# Patient Record
Sex: Male | Born: 1970 | Race: White | Hispanic: No | Marital: Married | Smoking: Former smoker
Health system: Southern US, Community
[De-identification: ages and names within clinical notes are randomized; demographics above are authoritative.]

## PROBLEM LIST (undated history)

## (undated) DIAGNOSIS — G4730 Sleep apnea, unspecified: Secondary | ICD-10-CM

## (undated) DIAGNOSIS — F419 Anxiety disorder, unspecified: Secondary | ICD-10-CM

## (undated) DIAGNOSIS — T7840XA Allergy, unspecified, initial encounter: Secondary | ICD-10-CM

## (undated) DIAGNOSIS — J45909 Unspecified asthma, uncomplicated: Secondary | ICD-10-CM

## (undated) DIAGNOSIS — Z889 Allergy status to unspecified drugs, medicaments and biological substances status: Secondary | ICD-10-CM

## (undated) DIAGNOSIS — E785 Hyperlipidemia, unspecified: Secondary | ICD-10-CM

## (undated) DIAGNOSIS — G43909 Migraine, unspecified, not intractable, without status migrainosus: Secondary | ICD-10-CM

## (undated) DIAGNOSIS — Z923 Personal history of irradiation: Secondary | ICD-10-CM

## (undated) DIAGNOSIS — M1990 Unspecified osteoarthritis, unspecified site: Secondary | ICD-10-CM

## (undated) DIAGNOSIS — C801 Malignant (primary) neoplasm, unspecified: Secondary | ICD-10-CM

## (undated) DIAGNOSIS — J3489 Other specified disorders of nose and nasal sinuses: Secondary | ICD-10-CM

## (undated) DIAGNOSIS — F32A Depression, unspecified: Secondary | ICD-10-CM

## (undated) DIAGNOSIS — R040 Epistaxis: Secondary | ICD-10-CM

## (undated) HISTORY — DX: Anxiety disorder, unspecified: F41.9

## (undated) HISTORY — PX: VASECTOMY: SHX75

## (undated) HISTORY — DX: Allergy, unspecified, initial encounter: T78.40XA

## (undated) HISTORY — DX: Depression, unspecified: F32.A

## (undated) HISTORY — DX: Malignant (primary) neoplasm, unspecified: C80.1

## (undated) HISTORY — DX: Sleep apnea, unspecified: G47.30

## (undated) HISTORY — DX: Unspecified osteoarthritis, unspecified site: M19.90

---

## 2004-03-26 ENCOUNTER — Ambulatory Visit (HOSPITAL_BASED_OUTPATIENT_CLINIC_OR_DEPARTMENT_OTHER): Admission: RE | Admit: 2004-03-26 | Discharge: 2004-03-26 | Payer: Self-pay | Admitting: *Deleted

## 2008-01-17 ENCOUNTER — Ambulatory Visit (HOSPITAL_BASED_OUTPATIENT_CLINIC_OR_DEPARTMENT_OTHER): Admission: RE | Admit: 2008-01-17 | Discharge: 2008-01-17 | Payer: Self-pay | Admitting: Family Medicine

## 2008-01-21 ENCOUNTER — Ambulatory Visit: Payer: Self-pay | Admitting: Internal Medicine

## 2010-09-23 NOTE — Procedures (Signed)
NAME:  Micheal Faulkner, Micheal Faulkner                ACCOUNT NO.:  [id number]   MEDICAL RECORD NO.:  [id number]          PATIENT TYPE:  OUT   LOCATION:  SLEEP CENTER                 FACILITY:  [facility]   PHYSICIAN:  Clinton D. Maple Hudson, MD, FCCP, FACPDATE OF BIRTH:  1969-08-06   DATE OF STUDY:  01/17/2008                            NOCTURNAL POLYSOMNOGRAM   REFERRING PHYSICIAN:  Shade Flood, M.D.   INDICATION FOR STUDY:  Hypersomnia with sleep apnea.   EPWORTH SLEEPINESS SCORE:  10/24.  BMI 26.4.  Weight 184 pounds, height  70 inches.  Neck 17 inches.  A previous study on March 26, 2004, had  recorded an AHI of 19.6 per hour.  At that time his weight was recorded  at 187 pounds.   HOME MEDICATIONS:  Charted and reviewed.   SLEEP ARCHITECTURE:  Total sleep time 366 minutes with sleep efficiency  79.1%.  Stage 1 was 7.7%.  Stage 2 was 72.3%.  Stage 3 was 4.4%.  REM  15.7% of total sleep time.  Sleep latency 65 minutes.  Rem latency 71.5  minutes.  Awake after sleep onset 31 minutes.  Arousal index 10.5.  Bedtime medication was valerian root 225 mg.   RESPIRATORY DATA:  Apnea/hypopnea index (AHI), 4.4 per hour.  Respiratory disturbance index (RDI), 4.6 per hour.  Respiratory events  with related arousals:  Counted only 1.  A total of 27 events were  scored, all hypopneas.  All events were reported as non-supine.  REM AHI  was 17.7.   OXYGEN DATA:  Loud snoring with oxygen desaturation to a Nadir of 88%.  Mean oxygen saturation through the study 94% on room air.   CARDIAC DATA:  Normal sinus rhythm.   MOVEMENT/PARASOMNIA:  No significant movement disturbance.  No bathroom  trips.   IMPRESSIONS/RECOMMENDATIONS:  1. Occasional respiratory events disturbing sleep, within normal      limits.  Apnea/hypopnea index 4.4 per hour (normal range 0-5 per      hour).  The events were primarily associated with rapid eye      movement.  Snoring was loud with oxygen desaturation to a Nadir of      88%.  2. On a previous study of March 26, 2004, the apnea/hypopnea index      was reported at 19.6 per hour.  Recorded weight was a few pounds      heavier at 187 pounds.  3. The assessment of snoring may reveal correctable upper airway nasal      congestion or obstruction.  He can be encouraged to sleep off the      side of his back, to consider a chin strap and to maintain good      sleep habits.      Clinton D. Maple Hudson, MD, FCCP, FACP  Diplomate, Biomedical engineer of Sleep Medicine  Electronically Signed     CDY/MEDQ  D:  01/21/2008 10:44:00  T:  01/21/2008 11:33:50  Job:  [id number]

## 2010-09-26 NOTE — Procedures (Signed)
NAME:  Micheal Faulkner, Micheal Faulkner                ACCOUNT NO.:  [id number]   MEDICAL RECORD NO.:  [id number]          PATIENT TYPE:  OUT   LOCATION:  SLEEP CENTER                 [facility]ACILITY[facility]  [facility]   PHYSICIAN:  Clinton D. Maple Hudson, M.D. DATE OF BIRTH:  1970/06/03   DATE OF STUDY:  03/26/2004                              NOCTURNAL POLYSOMNOGRAM   REFERRING PHYSICIAN:  Coralee Rud, MD   INDICATIONS FOR STUDY:  Hypersomnia with sleep apnea. Epworth sleepiness  score 14/24, BMI 26, weight 187 pounds.   SLEEP ARCHITECTURE:  Total sleep time 367 minutes with sleep efficiency of  89%. Stage I was 5%, stage II was 70%, stages III and IV were 7%, REM was  18% of total sleep time. Latency to sleep onset 8 minutes. Latency to REM  179 minutes. Awake after sleep onset 40 minutes. Arousal index elevated 75  per hour. Most arousals were nonspecific.   RESPIRATORY DATA:  RDI 19.6 per hour indicating moderate obstructive sleep  apnea/hypopnea syndrome. This included 23 obstructive apneas and 97  hypopneas. Events were not positional. Split protocol titration could not be  performed because there were insufficient events to prevent evaluation.   OXYGEN DATA:  Variable snoring, mild to loud, with oxygen desaturation to an  nadir of 81% during apneas. Mean oxygen saturation through study was 94% on  room air.   CARDIAC DATA:  Normal sinus rhythm.   MOVEMENT/PARASOMNIA:  Occasional leg jerk with little effect on sleep.   IMPRESSION/RECOMMENDATIONS:  Moderate obstructive sleep apnea/hypopnea  syndrome, respiratory disturbance index 19.6 per hour with desaturation to  81%. Consider return for CPAP titration if clinically appropriate.                                                           Clinton D. Maple Hudson, M.D.  Diplomate, [employer] Board   CDY/MEDQ  D:  03/30/2004 14:59:11  T:  03/30/2004 22:15:22  Job:  [id number]

## 2013-03-11 ENCOUNTER — Emergency Department (HOSPITAL_COMMUNITY)
Admission: EM | Admit: 2013-03-11 | Discharge: 2013-03-11 | Disposition: A | Discharge status: Home / Self Care | Payer: BC Managed Care – PPO | Source: Home / Self Care | Attending: Emergency Medicine | Admitting: Emergency Medicine

## 2013-03-11 ENCOUNTER — Encounter (HOSPITAL_COMMUNITY): Payer: Self-pay | Admitting: Emergency Medicine

## 2013-03-11 DIAGNOSIS — S39012A Strain of muscle, fascia and tendon of lower back, initial encounter: Secondary | ICD-10-CM

## 2013-03-11 DIAGNOSIS — S335XXA Sprain of ligaments of lumbar spine, initial encounter: Secondary | ICD-10-CM

## 2013-03-11 MED ORDER — DICLOFENAC SODIUM 75 MG PO TBEC: 75.0000 mg | DELAYED_RELEASE_TABLET | Freq: Two times a day (BID) | ORAL | Status: AC

## 2013-03-11 MED ORDER — OXYCODONE-ACETAMINOPHEN 5-325 MG PO TABS: ORAL_TABLET | ORAL | Status: DC

## 2013-03-11 MED ORDER — KETOROLAC TROMETHAMINE 60 MG/2ML IM SOLN
INTRAMUSCULAR | Status: AC
  Filled 2013-03-11: qty 2

## 2013-03-11 MED ORDER — TIZANIDINE HCL 4 MG PO TABS: 4.0000 mg | ORAL_TABLET | Freq: Four times a day (QID) | ORAL | Status: DC | PRN

## 2013-03-11 MED ORDER — KETOROLAC TROMETHAMINE 60 MG/2ML IM SOLN
60.0000 mg | Freq: Once | INTRAMUSCULAR | Status: AC
  Administered 2013-03-11: 60 mg via INTRAMUSCULAR

## 2013-03-11 NOTE — ED Notes (Signed)
Pt reports  Back pain for  About 1  Week  Not releived  By  Flexeril  -  Pt  Reports         Hurts  On  Certain  Movements  /  posistions            Pt  denys  Any  specefic  Urinary  Symptoms           Ambulated  To  Room with a  Steady fluid  Gait

## 2013-03-11 NOTE — ED Provider Notes (Signed)
Chief Complaint:   Chief Complaint  Patient presents with  . Back Pain    History of Present Illness:   Micheal Faulkner is a 71 or male who has had a one-week history of lower back pain. He denies any injury. The pain does not radiate into the legs. There is no numbness, tingling, paresthesias, or weakness in the legs. The pain is rated 8/10 in intensity. He has tried cyclobenzaprine without relief. He does not have any prior history of back problems. He denies any incontinence of bladder or bowel, saddle anesthesia, fevers, chills, or unintended weight loss.  Review of Systems:  Other than noted above, the patient denies any of the following symptoms: Systemic:  No fever, chills, severe fatigue, or unexplained weight loss. GI:  No abdominal pain, nausea, vomiting, diarrhea, constipation, incontinence of bowel, or blood in stool. GU:  No dysuria, frequency, urgency, or hematuria. No incontinence of urine or difficulty urinating.  M-S:  No neck pain, joint pain, arthritis, or myalgias. Neuro:  No paresthesias, saddle anesthesia, muscular weakness, or progressive neurological deficit.  PMFSH:  Past medical history, family history, social history, meds, and allergies were reviewed. Specifically, there is no history of cancer, major trauma, osteoporosis, immunosuppression, or HIV infection. He has elevated cholesterol and takes Lipitor. He also takes testosterone gel.  Physical Exam:   Vital signs:  BP 152/95  Pulse 69  Temp(Src) 98.1 F (36.7 C) (Oral)  Resp 16  SpO2 100% General:  Alert, oriented, in no distress. Abdomen:  Soft, non-tender.  No organomegaly or mass.  No pulsatile midline abdominal mass or bruit. Back:  Minimal pain to palpation in the lower lumbar spine bilaterally just above the iliac crest. No pain over the sacroiliac joints and no pain at the midline. The back has a limited range of motion with 30 of flexion, 10 of extension, 20 of lateral bending, and 90 of rotation with  pain. Straight leg raising is negative on the left. On the right he has some lower back pain with straight leg raising no leg pain. Lasegue's sign and popliteal compression are negative. Neuro:  Normal muscle strength, sensations and DTRs. Extremities: Pedal pulses were full, there was no edema. Skin:  Clear, warm and dry.  No rash.  Course in Urgent Care Center:   Given Toradol 60 mg IM.  Assessment:  The encounter diagnosis was Lumbar strain, initial encounter.  No evidence of lumbar radiculopathy or cauda equina syndrome.  Plan:   1.  Meds:  The following meds were prescribed:   Discharge Medication List as of 03/11/2013 12:13 PM    START taking these medications   Details  diclofenac (VOLTAREN) 75 MG EC tablet Take 1 tablet (75 mg total) by mouth 2 (two) times daily., Starting 03/11/2013, Until Discontinued, Normal    oxyCODONE-acetaminophen (PERCOCET) 5-325 MG per tablet 1 to 2 tablets every 6 hours as needed for pain., Print    tiZANidine (ZANAFLEX) 4 MG tablet Take 1 tablet (4 mg total) by mouth every 6 (six) hours as needed., Starting 03/11/2013, Until Discontinued, Normal        2.  Patient Education/Counseling:  The patient was given appropriate handouts, self care instructions, and instructed in symptomatic relief. The patient was encouraged to try to be as active as possible and given some exercises to do followed by moist heat.  3.  Follow up:  The patient was told to follow up if no better in 3 to 4 days, if becoming worse in any  way, and given some red flag symptoms such as worsening pain or new neurological symptoms which would prompt immediate return.  Follow up with his primary care physician, Dr. Mosetta Putt in 2 weeks.     Reuben Likes, MD 03/11/13 1322

## 2013-11-01 ENCOUNTER — Other Ambulatory Visit: Payer: Self-pay | Admitting: Family Medicine

## 2013-11-01 DIAGNOSIS — R748 Abnormal levels of other serum enzymes: Secondary | ICD-10-CM

## 2013-11-09 ENCOUNTER — Ambulatory Visit
Admission: RE | Admit: 2013-11-09 | Discharge: 2013-11-09 | Disposition: A | Discharge status: Home / Self Care | Payer: BC Managed Care – PPO | Source: Ambulatory Visit | Attending: Family Medicine | Admitting: Family Medicine

## 2013-11-09 DIAGNOSIS — R748 Abnormal levels of other serum enzymes: Secondary | ICD-10-CM

## 2016-11-19 ENCOUNTER — Other Ambulatory Visit: Payer: Self-pay | Admitting: Family Medicine

## 2016-11-19 DIAGNOSIS — R51 Headache: Secondary | ICD-10-CM

## 2016-11-19 DIAGNOSIS — J329 Chronic sinusitis, unspecified: Secondary | ICD-10-CM

## 2016-11-19 DIAGNOSIS — R519 Headache, unspecified: Secondary | ICD-10-CM

## 2016-11-20 ENCOUNTER — Other Ambulatory Visit: Payer: Self-pay

## 2016-11-23 ENCOUNTER — Ambulatory Visit
Admission: RE | Admit: 2016-11-23 | Discharge: 2016-11-23 | Disposition: A | Discharge status: Home / Self Care | Payer: PRIVATE HEALTH INSURANCE | Source: Ambulatory Visit | Attending: Family Medicine | Admitting: Family Medicine

## 2016-11-23 DIAGNOSIS — R519 Headache, unspecified: Secondary | ICD-10-CM

## 2016-11-23 DIAGNOSIS — R51 Headache: Secondary | ICD-10-CM

## 2016-11-23 DIAGNOSIS — J329 Chronic sinusitis, unspecified: Secondary | ICD-10-CM

## 2016-11-23 MED ORDER — IOPAMIDOL (ISOVUE-300) INJECTION 61%
75.0000 mL | Freq: Once | INTRAVENOUS | Status: AC | PRN
  Administered 2016-11-23: 75 mL via INTRAVENOUS

## 2016-12-09 DIAGNOSIS — J3489 Other specified disorders of nose and nasal sinuses: Secondary | ICD-10-CM

## 2016-12-09 DIAGNOSIS — R040 Epistaxis: Secondary | ICD-10-CM

## 2016-12-09 HISTORY — DX: Other specified disorders of nose and nasal sinuses: J34.89

## 2016-12-09 HISTORY — DX: Epistaxis: R04.0

## 2017-01-19 HISTORY — PX: OTHER SURGICAL HISTORY: SHX169

## 2017-01-19 HISTORY — PX: RHINECTOMY: SUR1283

## 2017-02-11 HISTORY — PX: FREE FLAP RADIAL FOREARM: SHX1678

## 2017-02-11 HISTORY — PX: OTHER SURGICAL HISTORY: SHX169

## 2017-03-26 ENCOUNTER — Encounter: Payer: Self-pay | Admitting: Radiation Oncology

## 2017-03-26 ENCOUNTER — Telehealth: Payer: Self-pay | Admitting: *Deleted

## 2017-03-26 NOTE — Telephone Encounter (Signed)
Oncology Nurse Navigator Documentation  Requested via fax imaging from [facility] for new referral received today, 11/16: . CT Neck, 11/23/2016 . MR Face and Sinus W WO Contrast, 01/07/2017 . CT Neck/Thyroid W Contrast, 02/01/2017 . CT Chest W Contrast, 02/01/2017 Notification of successful fax transmission received.  Gayleen Orem, RN, BSN, [place] Neck Oncology Nurse [place] at [place] [phone]

## 2017-03-26 NOTE — Telephone Encounter (Signed)
Oncology Nurse Navigator Documentation  Placed new referral introductory call to patient, LVMM asking for call back.  Gayleen Orem, RN, BSN, [place] Neck Oncology Nurse [place] at [place] [phone]

## 2017-03-26 NOTE — Progress Notes (Signed)
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 C.SUPERIOR SEPTUM (BIOPSY): Benign salivary gland, negative for malignancy. D.RIGHT PIRIFORM APERTURE (BIOPSY): Benign skeletal muscle, negative for malignancy. E.LEFT NASAL VESTIBULE (BIOPSY): Atypical epithelial cell nests with extensive crushed artifact, suspicious for squamous cell carcinoma. See comment. F.LEFT PIRIFORM VESTIBULE (BIOPSY): Atypical epithelial cell nests with extensive crushed artifact, suspicious for squamous cell carcinoma.See comment. 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 I.RIGHT NEW DORSAL MARGIN (BIOPSY): Benign skin with solar elastosis, negative for malignancy. J.LEFT NEW DORSAL MARGIN (BIOPSY): Benign skin with solar elastosis, negative for malignancy. 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 M.LEFT FLOOR OF NOSE MUCOSA (RESECTION): Benign sinonasal mucosal tissue, negative for malignancy. N.RIGHT FLOOR OF NOSE MUCOSA (RESECTION): Benign sinonasal mucosal tissue, negative for malignancy. O.SOFT TISSUE MASS, LEFT NASAL VESTIBULE (RESECTION): Benign mucinous salivary gland, negative for malignancy. P.RIGHT LATERAL NASAL WALL (RESECTION): Benign sinonasal mucosal tissue and bone,  negative for malignancy. Q.RIGHT MIDDLE TURBINATE (RESECTION): Benign sinonasal mucosal tissue and bone, negative for malignancy. 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 S.POSTERIOR SEPTUM (RESECTION): Benign sinonasal mucosal tissue, bone, negative for malignancy. 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 Tumor seen widely and extensively involving sinonasal mucosal tissues, soft tissue and septal bone. No definitive lymphovascular or perineural invasion identified. Margins of resection including posterior, inferior, medial positive for tumor. U.DEEP COLUMELLA (RESECTION): Benign skin with marked acute and chronic inflammation, cartilage, negative for malignancy. 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   Patient presented months ago with symptoms of: Micheal Faulkner presented to Dr. Janace Hoard on 12/07/16 with a 4 month history of significant nasal congestion for 4 months with bleeding. Micheal Faulkner saw Dr. Vicie Faulkner for the first time on 01/04/17.  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   Nutrition Status Yes No Comments  Weight changes? [x]  []  Micheal Faulkner reports losing about 30 lbs since August. Micheal Faulkner has gained back about 10 lbs.   Swallowing concerns? [x]  []  The right side of his mouth is numb. Micheal Faulkner has difficulty swallowing. Micheal Faulkner is eating softer foods and cuts his food well before eating  PEG? []  [x]     Referrals Yes No Comments   Social Work? [x]  []  Micheal Faulkner has seen social work at [facility]. Micheal Faulkner will be referred to social work here based on his distress screening.   Dentistry? [x]  []  Dr. Enrique Faulkner today  Swallowing therapy? []  [x]    Nutrition? []  [x]    Med/Onc? []  [x]     Safety Issues Yes No Comments  Prior radiation? []  [x]    Pacemaker/ICD? []  [x]    Possible current pregnancy? []  [x]    Is the patient on methotrexate? []  [x]     Tobacco/Marijuana/Snuff/ETOH use: Micheal Faulkner quit smoking and drinking in August with his cancer  diagnosis.   Past/Anticipated interventions by otolaryngology, if any:  Dr. Vicie Faulkner [facility] Procedures/Surgeries performed during hospitalization:  01/19/2017: Partial rhinectomy, septectomy, and right medial maxillectomy   02/11/17 Dr. Brayton Faulkner Procedures/Surgeries performed during hospitalization:  ROTATION FOREHEAD FLAP NASOLABIAL FLAP RIB GRAFT HARVEST FREE FLAP RADIAL FOREARM- Dr Micheal Faulkner    Past/Anticipated interventions by medical oncology, if any:  none   Current Complaints / other details:    BP (!) 102/59   Pulse (!) 55   Temp (!) 97.5 F (36.4 C)   Ht 5\' 9"  (1.753 m)   Wt 165 lb 12.8 oz (75.2 kg)   SpO2 99% Comment: room air  BMI 24.48 kg/m    Wt Readings from Last 3 Encounters:  03/31/17 165 lb 12.8 oz (75.2 kg)

## 2017-03-28 ENCOUNTER — Ambulatory Visit
Admission: RE | Admit: 2017-03-28 | Discharge: 2017-03-28 | Disposition: A | Discharge status: Home / Self Care | Payer: Self-pay | Source: Ambulatory Visit | Attending: Radiation Oncology | Admitting: Radiation Oncology

## 2017-03-28 ENCOUNTER — Encounter: Payer: Self-pay | Admitting: *Deleted

## 2017-03-28 ENCOUNTER — Other Ambulatory Visit: Payer: Self-pay | Admitting: *Deleted

## 2017-03-28 DIAGNOSIS — C300 Malignant neoplasm of nasal cavity: Secondary | ICD-10-CM

## 2017-03-28 NOTE — Progress Notes (Signed)
Oncology Nurse Navigator Documentation  Orders placed for outside film [employer] import.  Films requested by fax to [facility] 03/26/2017, same day verbal acknowledgement received   Gayleen Orem, RN, BSN, [place] at [place] [phone]

## 2017-03-29 ENCOUNTER — Telehealth: Payer: Self-pay | Admitting: *Deleted

## 2017-03-29 NOTE — Telephone Encounter (Signed)
Oncology Nurse Navigator Documentation  Placed introductory call to new referral patient Micheal Faulkner in follow-up to previous VMM.  Introduced myself as the H&N oncology nurse navigator that works with Dr. Isidore Moos to whom he has been referred by Dr. Vicie Mutters.    He confirmed his understanding of referral and appt date/time of 11/21 8:30/9:00, stated either his wife or dtr wil be joining him.  I encouraged 8:15 arrival for registration.  I briefly explained my role as his navigator, indicated that I would be joining him during his appt.  I explained purpose of pre-radiotherapy dental evaluation, indicated he would be contacted by [place] to arrange appt.  He indicated has all own teeth, 1 tooth capped; hasn't seen dentist "in a while".  Answered his questions re timeline to start tmt.    I confirmed his understanding of [place] location, explained arrival and RadOnc registration process for appt.  I provided my contact information, encouraged him to call with questions/concerns prioor to appt.  He verbalized understanding of information provided, expressed appreciation for my call.  Gayleen Orem, RN, BSN, [place] Neck Oncology Nurse [place] at [place] [phone]

## 2017-03-30 DIAGNOSIS — C300 Malignant neoplasm of nasal cavity: Secondary | ICD-10-CM

## 2017-03-30 DIAGNOSIS — Z01818 Encounter for other preprocedural examination: Secondary | ICD-10-CM

## 2017-03-31 ENCOUNTER — Encounter: Payer: Self-pay | Admitting: Radiation Oncology

## 2017-03-31 ENCOUNTER — Encounter: Payer: Self-pay | Admitting: *Deleted

## 2017-03-31 ENCOUNTER — Other Ambulatory Visit (HOSPITAL_COMMUNITY): Payer: Self-pay | Admitting: Dentistry

## 2017-03-31 ENCOUNTER — Ambulatory Visit
Admission: RE | Admit: 2017-03-31 | Discharge: 2017-03-31 | Disposition: A | Discharge status: Home / Self Care | Payer: PRIVATE HEALTH INSURANCE | Source: Ambulatory Visit | Attending: Radiation Oncology | Admitting: Radiation Oncology

## 2017-03-31 ENCOUNTER — Ambulatory Visit (HOSPITAL_COMMUNITY): Payer: Self-pay | Admitting: Dentistry

## 2017-03-31 ENCOUNTER — Encounter (HOSPITAL_COMMUNITY): Payer: Self-pay | Admitting: Dentistry

## 2017-03-31 ENCOUNTER — Other Ambulatory Visit: Payer: Self-pay | Admitting: Radiation Oncology

## 2017-03-31 VITALS — BP 102/59 | HR 55 | Temp 97.5°F | Ht 69.0 in | Wt 165.8 lb

## 2017-03-31 VITALS — BP 112/69 | HR 52 | Temp 97.8°F

## 2017-03-31 DIAGNOSIS — Z510 Encounter for antineoplastic radiation therapy: Secondary | ICD-10-CM | POA: Insufficient documentation

## 2017-03-31 DIAGNOSIS — C300 Malignant neoplasm of nasal cavity: Secondary | ICD-10-CM | POA: Insufficient documentation

## 2017-03-31 DIAGNOSIS — M270 Developmental disorders of jaws: Secondary | ICD-10-CM

## 2017-03-31 DIAGNOSIS — M264 Malocclusion, unspecified: Secondary | ICD-10-CM

## 2017-03-31 DIAGNOSIS — K036 Deposits [accretions] on teeth: Secondary | ICD-10-CM

## 2017-03-31 DIAGNOSIS — K0530 Chronic periodontitis, unspecified: Secondary | ICD-10-CM

## 2017-03-31 DIAGNOSIS — Z01818 Encounter for other preprocedural examination: Secondary | ICD-10-CM

## 2017-03-31 DIAGNOSIS — K030 Excessive attrition of teeth: Secondary | ICD-10-CM

## 2017-03-31 DIAGNOSIS — K08409 Partial loss of teeth, unspecified cause, unspecified class: Secondary | ICD-10-CM

## 2017-03-31 DIAGNOSIS — E785 Hyperlipidemia, unspecified: Secondary | ICD-10-CM | POA: Diagnosis not present

## 2017-03-31 DIAGNOSIS — G43909 Migraine, unspecified, not intractable, without status migrainosus: Secondary | ICD-10-CM | POA: Insufficient documentation

## 2017-03-31 HISTORY — DX: Hyperlipidemia, unspecified: E78.5

## 2017-03-31 HISTORY — DX: Migraine, unspecified, not intractable, without status migrainosus: G43.909

## 2017-03-31 HISTORY — DX: Allergy status to unspecified drugs, medicaments and biological substances: Z88.9

## 2017-03-31 HISTORY — DX: Unspecified asthma, uncomplicated: J45.909

## 2017-03-31 HISTORY — DX: Other specified disorders of nose and nasal sinuses: J34.89

## 2017-03-31 HISTORY — DX: Epistaxis: R04.0

## 2017-03-31 MED ORDER — SODIUM FLUORIDE 1.1 % DT GEL: DENTAL | 99 refills | Status: AC

## 2017-03-31 MED FILL — FLUORISHIELD 1.1% GEL: 1.1 % | 30 days supply | Qty: 114 | Fill #0

## 2017-03-31 NOTE — Progress Notes (Signed)
DENTAL CONSULTATION  Date of Consultation:  03/31/2017 Patient Name:   Tyray Proch Date of Birth:   Oct 04, 1970 Medical Record Number: [id number]  VITALS: BP 112/69 (BP Location: Left Arm)   Pulse (!) 52   Temp 97.8 F (36.6 C) (Oral)   CHIEF COMPLAINT: The patient was referred by Dr. Isidore Moos for dental consultation.  HPI: Aison Malveaux is a 46 year old male recently diagnosed cancer of the nasal cavity.  Patient is status post surgical resection followed by several reconstructive surgeries of his nose and nasal cavity.  Patient with anticipated postoperative radiation therapy.  Patient is now seen as part of a pre-radiation therapy dental protocol examination.  The patient currently denies acute toothaches, swelling, or abscesses.  Patient was last seen by a dentist approximately 2 years ago for an exam and cleaning.  The patient cannot remember the name of the dentist that he saw.  Patient denies having any unmet dental needs.  Patient denies having dental phobia.  PROBLEM LIST: Patient Active Problem List   Diagnosis Date Noted  . 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 Asthma   . Epistaxis, recurrent 12/2016  . H/O seasonal allergies   . Hyperlipidemia   . Migraine   . Nasal mass 12/2016    PSH: Past Surgical History:  Procedure Laterality Date  . FREE FLAP RADIAL FOREARM  02/11/2017  . nasolabial flap  02/11/2017  . posterior septectomy  01/19/2017    Posterior Septectomy, Marijo Conception MD [facility].   Marland Kitchen RHINECTOMY  01/19/2017   Rhinectomy partial, surgeon, Marijo Conception MD at [facility].   . rib graft harvest  02/11/2017  . rotation forehead flap  02/11/2017  . VASECTOMY      ALLERGIES: No Known Allergies  MEDICATIONS: Current Outpatient Medications  Medication Sig Dispense Refill  . acetaminophen (TYLENOL) 500 MG tablet Take 1,000 mg by mouth.    . ALPRAZolam (XANAX) 0.5 MG tablet Take 0.5 mg  by mouth.    Marland Kitchen aspirin 325 MG tablet Take 325 mg by mouth.    . celecoxib (CELEBREX) 200 MG capsule TAKE 1 CAPSULE (200 MG TOTAL) BY MOUTH 2 TIMES DAILY.  2  . diazepam (VALIUM) 5 MG tablet TAKE 1/2 TO 1 TABLET BY MOUTH TWICE A DAY AS NEEDED FOR ANXIETY OR SLEEP  5  . escitalopram (LEXAPRO) 10 MG tablet TAKE 1 TABLET BY MOUTH ONCE DAILY IN THE MORNING  1  . HYDROcodone-acetaminophen (NORCO/VICODIN) 5-325 MG tablet Take by mouth.    Marland Kitchen ibuprofen (ADVIL,MOTRIN) 200 MG tablet Take 200 mg by mouth.    . Multiple Vitamins-Minerals (MULTIVITAMIN WITH MINERALS) tablet Take 1 tablet by mouth daily.    . rosuvastatin (CRESTOR) 10 MG tablet TAKE 1 TABLET BY MOUTH EVERY EVENING AFTER A MEAL  1  . Testosterone 25 MG/2.5GM (1%) GEL Apply 1 packet topically daily.    . traZODone (DESYREL) 50 MG tablet TAKE 1 TABLET BY MOUTH EVERY EVENING AT BEDTIME AS NEEDED FOR SLEEP  3  . V-R NASAL SPRAY SALINE 0.65 % nasal spray   2  . zolpidem (AMBIEN) 10 MG tablet TAKE 1/2 TABLET BY MOUTH AT BEDTIME AS NEEDED FOR SLEEP  2  . diclofenac (VOLTAREN) 75 MG EC tablet Take 1 tablet (75 mg total) by mouth 2 (two) times daily. (Patient not taking: Reported on 03/31/2017) 30 tablet 0  . tadalafil (CIALIS) 10 MG tablet Take 10 mg by mouth.    Marland Kitchen  tiZANidine (ZANAFLEX) 4 MG tablet Take 1 tablet (4 mg total) by mouth every 6 (six) hours as needed. (Patient not taking: Reported on 03/31/2017) 30 tablet 0   No current facility-administered medications for this visit.     LABS: No results found for: WBC, HGB, HCT, MCV, PLT No results found for: NA, K, CL, CO2, GLUCOSE, BUN, CREATININE, CALCIUM, GFRNONAA, GFRAA No results found for: INR, PROTIME No results found for: PTT  SOCIAL HISTORY: Social History   Socioeconomic History  . Marital status: Legally Separated    Spouse name: Not on file  . Number of children: Not on file  . Years of education: Not on file  . Highest education level: Not on file  Social Needs  . Financial  resource strain: Not on file  . Food insecurity - worry: Not on file  . Food insecurity - inability: Not on file  . Transportation needs - medical: Not on file  . Transportation needs - non-medical: Not on file  Occupational History  . Not on file  Tobacco Use  . Smoking status: Former Smoker    Packs/day: 0.50    Years: 30.00    Pack years: 15.00    Last attempt to quit: 01/03/2017    Years since quitting: 0.2  . Smokeless tobacco: Never Used  Substance and Sexual Activity  . Alcohol use: No    Frequency: Never    Comment: he has not had alcohol since cancer diagnosis  . Drug use: No  . Sexual activity: Not on file  Other Topics Concern  . Not on file  Social History Narrative  . Not on file    FAMILY HISTORY: History reviewed. No pertinent family history.  The patient was adopted.  REVIEW OF SYSTEMS: Reviewed with the patient as per History of present illness. Psych: Patient denies having dental phobia.  DENTAL HISTORY: CHIEF COMPLAINT: The patient was referred by Dr. Isidore Moos for dental consultation.  HPI: Tiernan Suto is a 46 year old male recently diagnosed cancer of the nasal cavity.  Patient is status post surgical resection followed by several reconstructive surgeries of his nose and nasal cavity.  Patient with anticipated postoperative radiation therapy.  Patient is now seen as part of a pre-radiation therapy dental protocol examination.  The patient currently denies acute toothaches, swelling, or abscesses.  Patient was last seen by a dentist approximately 2 years ago for an exam and cleaning.  The patient cannot remember the name of the dentist that he saw.  Patient denies having any unmet dental needs.  Patient denies having dental phobia.  DENTAL EXAMINATION: GENERAL: The patient is well-developed, well-nourished male in no acute distress. HEAD AND NECK: The patient's nose and forehead is consistent with surgical resection and reconstruction.  Patient has  evidence of right neck dissection.  There is no left neck lymphadenopathy noted.  Patient has a maximum interincisal opening of approximately 40 mm. INTRAORAL EXAM: Patient has normal saliva.  Patient has bilateral mandibular lingual tori.  There is no evidence of oral abscess formation. DENTITION: Patient is missing tooth numbers 1, 16, 17, and 32.  The patient has generalized incisal and occlusal attrition.   PERIODONTAL: Patient has chronic periodontitis with minimal plaque accumulations, selective areas gingival recession and no significant tooth mobility. DENTAL CARIES/SUBOPTIMAL RESTORATIONS: No obvious dental caries are noted. ENDODONTIC: Patient currently denies acute pulpitis symptoms.  I do not see any evidence of periapical pathology. CROWN AND BRIDGE: The patient has a crown on tooth #21. PROSTHODONTIC: There are  no partial dentures. OCCLUSION: Patient has a poor occlusal scheme secondary to generalized incisal and occlusal attrition and an end to end occlusion.  RADIOGRAPHIC INTERPRETATION: Orthopantogram was taken today. Patient is missing tooth numbers 1, 16, 17, and 32.  There is incipient bone loss noted.  No obvious dental caries are noted.  Multiple dental restorations are noted.  No obvious periapical pathology or radiolucencies are noted.  There are surgical clips involving the right neck noted.   ASSESSMENTS: 1.  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 2.  Anticipated postoperative radiation therapy 3.  Pre-radiation therapy dental protocol 4.  Chronic periodontitis with incipient bone loss 5.  Accretions-minimal 6.  Generalized occlusal and incisal attrition 7.  Malocclusion  PLAN/RECOMMENDATIONS: 1. I discussed the risks, benefits, and complications of various treatment options with the patient in relationship to his medical and dental conditions, anticipated radiation therapy, and radiation therapy side  effects to include xerostomia, radiation caries, trismus, mucositis, taste changes, gum and  jawbone changes, and risk for infection and osteoradionecrosis. We discussed various treatment options to include no treatment, consideration for extraction of teeth in the primary field of radiation therapy, pre-prosthetic surgery as indicated, periodontal therapy, dental restorations, root canal therapy, crown and bridge therapy, implant therapy, and replacement of missing teeth as indicated. The patient currently wishes to defer any dental extractions at this time.  The patient does agree to proceed with impressions today for the future fabrication of a tongue positioner/radiation cone locator as well as fluoride trays. A prescription for fluoride therapy will be sent to [place] with refills for 1 year.     2. Discussion of findings with medical team and coordination of future medical and dental care as needed.  I spent in excess of  120 minutes during the conduct of this consultation and >50% of this time involved direct face-to-face encounter for counseling and/or coordination of the patient's care.    Lenn Cal, DDS

## 2017-03-31 NOTE — Progress Notes (Signed)
Radiation Oncology         [phone] ________________________________  Initial outpatient Consultation  Name: Micheal Faulkner MRN: [id number]  Date: 03/31/2017  DOB: May 29, 1970  ID:[id number], No Pcp Per  [name], MD   REFERRING PHYSICIAN: [name], MD  DIAGNOSIS:    ICD-10-CM   1. 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 male who presented to Dr. Janace Hoard on 12/07/16 with a 4 month history of significant nasal congestion for 4 months with bleeding. He saw Dr. Vicie Mutters for the first time on 01/04/17.  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   Dr. Vicie Mutters performed surgery on 01/19/17 and I have talked to him extensively about the results and procedure. Micheal Faulkner underwent partial Rhinectomy and Right medial maxillectomy. The greatest deminsion of the tumor was for 3.4cm. The histology was Basaloid squamous cell carcinoma, grade 2. While the margins were deemed postive on pathology report, Dr. Vicie Mutters informed me that they were in fact negative. no LVSI or PNI. 16 lymph nodes were removed from the right neck on 02/11/17, these were all negative. He underwent reconstruction of the lower nose by Dr. Brayton Caves and Dr. Vicie Mutters, with flaps from the forehead and radial forearm and rib graft. Patient was discussed at tumor board today and reviewed  his pre and post op imagining.   CT chest staging shows tiny pulmonary nodules favored to be benign.  Patient stated that he rinses out the internal tubes to get rid of odor smell. Patient also stated that a yellow mucous coming out of his nose. Patient was here today with daughter.   Swallowing issues, if any: No, he is able to swallow. But he has difficulty chewing, the right side of his mouth is numb. He is eating softer foods and cuts his food well before eating. No PEG tube.  Weight Changes: yes, he reports losing about 30 lbs since August. He has gained back 10 Ibs.  Tobacco history, if any: He quit smoking and drinking in January 03, 2017 with his cancer diagnosis.   ETOH abuse, if any: No  Exposure to repeated exhaust at work - [employer]  Right eye is a little "foggy" for vision  per history  PREVIOUS RADIATION THERAPY: No  PAST MEDICAL HISTORY:  has a past medical history of Asthma, Epistaxis, recurrent (12/2016), H/O seasonal allergies, Hyperlipidemia, Migraine, and Nasal mass (12/2016).    PAST SURGICAL HISTORY: Past Surgical History:  Procedure Laterality Date  . FREE FLAP RADIAL FOREARM  02/11/2017  . nasolabial flap  02/11/2017  . posterior septectomy  01/19/2017    Posterior Septectomy, Marijo Conception MD [facility].   Marland Kitchen RHINECTOMY  01/19/2017   Rhinectomy partial, surgeon, Marijo Conception MD at [facility].   . rib graft harvest  02/11/2017  . rotation forehead flap  02/11/2017  . VASECTOMY      FAMILY  HISTORY: family history is not on file.  SOCIAL HISTORY:  reports that he quit smoking about 2 months ago. He has a 15.00 pack-year smoking history. he has never used smokeless tobacco. He reports that he does not drink alcohol or use drugs.  ALLERGIES: Patient has no known allergies.  MEDICATIONS:  Current Outpatient Medications  Medication Sig Dispense Refill  . acetaminophen (TYLENOL) 500 MG tablet Take 1,000 mg by mouth.    . ALPRAZolam (XANAX) 0.5 MG  tablet Take 0.5 mg by mouth.    Marland Kitchen aspirin 325 MG tablet Take 325 mg by mouth.    . celecoxib (CELEBREX) 200 MG capsule TAKE 1 CAPSULE (200 MG TOTAL) BY MOUTH 2 TIMES DAILY.  2  . diazepam (VALIUM) 5 MG tablet TAKE 1/2 TO 1 TABLET BY MOUTH TWICE A DAY AS NEEDED FOR ANXIETY OR SLEEP  5  . escitalopram (LEXAPRO) 10 MG tablet TAKE 1 TABLET BY MOUTH ONCE DAILY IN THE MORNING  1  . HYDROcodone-acetaminophen (NORCO/VICODIN) 5-325 MG tablet Take by mouth.    Marland Kitchen ibuprofen (ADVIL,MOTRIN) 200 MG tablet Take 200 mg by mouth.    . Multiple Vitamins-Minerals (MULTIVITAMIN WITH MINERALS) tablet Take 1 tablet by mouth daily.    . rosuvastatin (CRESTOR) 10 MG tablet TAKE 1 TABLET BY MOUTH EVERY EVENING AFTER A MEAL  1  . Testosterone 25 MG/2.5GM (1%) GEL Apply 1 packet topically daily.    . traZODone (DESYREL) 50 MG tablet TAKE 1 TABLET BY MOUTH EVERY EVENING AT BEDTIME AS NEEDED FOR SLEEP  3  . V-R NASAL SPRAY SALINE 0.65 % nasal spray   2  . zolpidem (AMBIEN) 10 MG tablet TAKE 1/2 TABLET BY MOUTH AT BEDTIME AS NEEDED FOR SLEEP  2  . diclofenac (VOLTAREN) 75 MG EC tablet Take 1 tablet (75 mg total) by mouth 2 (two) times daily. (Patient not taking: Reported on 03/31/2017) 30 tablet 0  . sodium fluoride (FLUORISHIELD) 1.1 % GEL dental gel Instill 1 drop of gel per tooth space of fluoride tray.  Place over teeth for 5 minutes.  Remove.  Spit out excess.  Repeat nightly. 120 mL prn  . tadalafil (CIALIS) 10 MG tablet Take 10 mg by mouth.    Marland Kitchen tiZANidine (ZANAFLEX) 4 MG tablet Take 1 tablet (4 mg total) by mouth every 6 (six) hours as needed. (Patient not taking: Reported on 03/31/2017) 30 tablet 0   No current facility-administered medications for this encounter.     REVIEW OF SYSTEMS:  A 10+ POINT REVIEW OF SYSTEMS WAS OBTAINED including neurology, dermatology, psychiatry, cardiac, respiratory, lymph, extremities, GI, GU, Musculoskeletal, constitutional,  HEENT.  All pertinent positives are noted in the HPI.  All  others are negative.  PHYSICAL EXAM:  height is 5\' 9"  (1.753 m) and weight is 165 lb 12.8 oz (75.2 kg). His temperature is 97.5 F (36.4 C) (abnormal). His blood pressure is 102/59 (abnormal) and his pulse is 55 (abnormal). His oxygen saturation is 99%.   General: Alert and oriented, in no acute distress HEENT: Head is normocephalic. Extraocular movements are intact.  patient wears glasses. Oropharynx  clear. Patient has to use internal tubes to maintain nostrils opening for breathing. Mucous in inner lips, Buccal mucosa, roof of mouth and remainder of oral cavity are clear. Scar along his forehead from his graft surgery, reconstructive upper nose, the nasal ALA on the left is preserved and the lower inferior aspect of the nose preserved. The reconstructed nasal septum has some milky liquid that is  exuding  Foul  odor.  Neck: Neck is notable for a scar on the right side, healing well. Heart: Regular in rate and rhythm with no murmurs, rubs, or gallops. Heart rate is bradycardic.  Chest: Clear to auscultation bilaterally, with no rhonchi, wheezes, or rales. Abdomen: Soft, nontender, nondistended, with no rigidity or guarding. Extremities: No cyanosis or edema. Lymphatics: see Neck Exam Skin: Healing well over the wrist from his graft. Has some numbness, right palm underneath his thumb.  Musculoskeletal: symmetric strength and muscle tone throughout. Neurologic: Cranial nerves II through XII are grossly intact. No obvious focalities. Speech is fluent. Coordination is intact. Psychiatric: Judgment and insight are intact. Affect is appropriate.    ECOG = 1  0 - Asymptomatic (Fully active, able to carry on all predisease activities without restriction)  1 - Symptomatic but completely ambulatory (Restricted in physically strenuous activity but ambulatory and able to carry out work of a light or sedentary nature. For example, light housework, office work)  2 - Symptomatic, <50% in bed during the  day (Ambulatory and capable of all self care but unable to carry out any work activities. Up and about more than 50% of waking hours)  3 - Symptomatic, >50% in bed, but not bedbound (Capable of only limited self-care, confined to bed or chair 50% or more of waking hours)  4 - Bedbound (Completely disabled. Cannot carry on any self-care. Totally confined to bed or chair)  5 - Death   Eustace Pen MM, Creech RH, Tormey DC, et al. [phone]). "Toxicity and response criteria of the [facility]". [place] Oncol. 5 (6): 649-55   LABORATORY DATA:  No results found for: WBC, HGB, HCT, MCV, PLT CMP  No results found for: NA, K, CL, CO2, GLUCOSE, BUN, CREATININE, CALCIUM, PROT, ALBUMIN, AST, ALT, ALKPHOS, BILITOT, GFRNONAA, GFRAA       RADIOGRAPHY: I reviewed his images as above  IMPRESSION/PLAN:  This is a delightful patient,  with head and neck cancer. I recommend 6 weeks of radiotherapy for this patient to the tumor bed.  Among other tissues in the post op bed, I will treat along nasal septum/medial right maxilla to pterygoid plates on right per my discussion with Dr Vicie Mutters.  Fuse MRI for RT planning  We discussed the potential risks, benefits, and side effects of radiotherapy. We talked in detail about acute and late effects. We discussed that some of the most bothersome acute effects may be mucositis, dysgeusia, salivary changes, eye irritation, skin irritation, hair loss, dehydration, weight loss and fatigue. We talked about late effects which include but are not necessarily limited to injury of tissues in RT fields. No guarantees of treatment were given. A consent form was signed and placed in the patient's medical record. The patient is enthusiastic about proceeding with treatment. I look forward to participating in the patient's care.    Simulation (treatment planning) will take place after release by Dr Enrique Sack, dentist, who sees him today  We also discussed that the  treatment of head and neck cancer is a multidisciplinary process to maximize treatment outcomes and quality of life. For this reasons the following referrals have been or will be made:    Dentistry for dental evaluation, possible extractions in the radiation fields, and /or advice on reducing risk of cavities, osteoradionecrosis, or other oral issues.   Nutritionist for nutrition support during and after treatment.   Social work for social support.    Physical therapy due to risk of  lymphedema  and deconditioning.  After the consult, nasal septum was rinsed with water and swabbed to send specimen to lab to check for possible infection and see what antibiotic can be used.    __________________________________________   Eppie Gibson, MD   This document serves as a record of services personally performed by Eppie Gibson MD. It was created on her behalf by Delton Coombes, a trained medical scribe. The creation of this record is based on the scribe's personal observations and the provider's statements to them.

## 2017-03-31 NOTE — Patient Instructions (Signed)

## 2017-04-03 LAB — WOUND CULTURE

## 2017-04-06 ENCOUNTER — Other Ambulatory Visit: Payer: Self-pay | Admitting: Radiation Oncology

## 2017-04-06 ENCOUNTER — Ambulatory Visit (HOSPITAL_COMMUNITY): Payer: Self-pay | Admitting: Dentistry

## 2017-04-06 ENCOUNTER — Encounter (HOSPITAL_COMMUNITY): Payer: Self-pay | Admitting: Dentistry

## 2017-04-06 VITALS — BP 118/72 | HR 50 | Temp 97.7°F

## 2017-04-06 DIAGNOSIS — Z01818 Encounter for other preprocedural examination: Secondary | ICD-10-CM

## 2017-04-06 DIAGNOSIS — C300 Malignant neoplasm of nasal cavity: Secondary | ICD-10-CM

## 2017-04-06 DIAGNOSIS — Z463 Encounter for fitting and adjustment of dental prosthetic device: Secondary | ICD-10-CM

## 2017-04-06 MED ORDER — CIPROFLOXACIN HCL 500 MG PO TABS: 500.0000 mg | ORAL_TABLET | Freq: Two times a day (BID) | ORAL | 0 refills | Status: DC

## 2017-04-06 MED FILL — CIPROFLOXACIN HCL 500 MG TA: 500 | 12 days supply | Qty: 24 | Fill #0

## 2017-04-06 NOTE — Patient Instructions (Signed)
FLUORIDE TRAYS PATIENT INSTRUCTIONS    Obtain prescription from the pharmacy.  Don't be surprised if it needs to be ordered.  Be sure to let the pharmacy know when you are close to needing a new refill for them to have it ready for you without interruption of Fluoride use.  The best time to use your Fluoride is before bed time.  You must brush your teeth very well and floss before using the Fluoride in order to get the best use out of the Fluoride treatments.  Place 1 drop of Fluoride gel per tooth in the tray.  Place the tray on your lower teeth and your upper teeth.  Make sure the trays are seated all the way.  Remember, they only fit one way on your teeth.  Insert for 5 full minutes.  At the end of the 5 minutes, take the trays out.  SPIT OUT excess. .  Do NOT rinse your mouth!  Do NOT eat or drink after treatments for at least 30 minutes.  This is why the best time for your treatments is before bedtime.  Clean the inside of your Fluoride trays using COLD WATER and a toothbrush.  In order to keep your Trays from discoloring and free from odors, soak them overnight in denture cleaners such as Efferdent.  Do not use bleach or non denture products.  Store the trays in a safe dry place AWAY from any heat until your next treatment.  If anything happens to your Fluoride trays, or they don't fit as well after any dental work, please let us know as soon as possible.

## 2017-04-06 NOTE — Progress Notes (Signed)
Oncology Nurse Navigator Documentation  Met with Mr. Rogus during initial consult with Dr. Isidore Moos.  He was accompanied by his dtr Cyndall.   1. Further introduced myself as his Navigator, explained my role as a member of the Care Team.   2. Provided New Patient Information packet, discussed contents:  Contact information for physician(s), myself, other members of the Care Team.  Advance Directive information ([place] blue pamphlet with LCSW contact info)  Fall Prevention Patient Safety Plan  Appointment [place] sheet  [facility] map with highlight of [place]  SLP information sheet 3. Provided introductory explanation of radiation treatment including SIM planning and purpose of Aquaplast head and shoulder mask, showed them example.   4. I encouraged them to contact me with questions/concerns as treatments/procedures begin.  5. Escorted them the [facility] for pre-radiotherapy eval. They verbalized understanding of information provided.    Gayleen Orem, RN, BSN, [place] [facility] Oncology Nurse [place] [facility] [place] [phone]

## 2017-04-06 NOTE — Progress Notes (Signed)
04/06/2017  Patient:            Micheal Faulkner Date of Birth:  1970/11/24 MRN:                [id number]   BP 118/72 (BP Location: Left Arm)   Pulse (!) 50   Temp 97.7 F (36.5 C) (Oral)    Micheal Faulkner presents for fabrication of a tongue positioner and insertion of upper and lower fluoride trays.  PROCEDURE: Initial fabrication of the tongue positioner took place in the lab. 2 hours lab time. Fabrication continued chairside with patient. This appliance was inserted onto the lower teeth and adjusted as needed.   Additional increments of acrylic were added and positioned appropriately indexing the maxillary teeth. Appliance was adjusted and polished appropriately. The appliance was stable. Patient was instructed on use and care of the appliance.  Patient expressed understanding.  Upper and lower fluoride trays were tried in and adjusted as needed. [place] Trismus device was previously fabricated at 40 mm using 30 sticks. Postop instructions were provided and a written and verbal format concerning the use and care of appliances. All questions were answered. Patient to return to clinic for periodic oral examination during radiation therapy in  2-3 weeks. Patient to call if questions or problems arise before then.  Lenn Cal, DDS

## 2017-04-06 NOTE — Progress Notes (Signed)
Spoke with patient about culture/sensitivity results from nasal drainage/wound.  Rx for Cipro made to [place]. Pt picking it up now, will start this PM.  He is not taking Zanaflex any longer, so I deleted this from his med list.  -----------------------------------  Eppie Gibson, MD

## 2017-04-07 ENCOUNTER — Ambulatory Visit
Admission: RE | Admit: 2017-04-07 | Discharge: 2017-04-07 | Disposition: A | Discharge status: Home / Self Care | Payer: PRIVATE HEALTH INSURANCE | Source: Ambulatory Visit | Attending: Radiation Oncology | Admitting: Radiation Oncology

## 2017-04-07 ENCOUNTER — Encounter: Payer: Self-pay | Admitting: *Deleted

## 2017-04-07 DIAGNOSIS — C300 Malignant neoplasm of nasal cavity: Secondary | ICD-10-CM

## 2017-04-07 DIAGNOSIS — Z510 Encounter for antineoplastic radiation therapy: Secondary | ICD-10-CM | POA: Diagnosis not present

## 2017-04-07 NOTE — Progress Notes (Signed)
Head and Neck Cancer Simulation, IMRT treatment planning note   Outpatient  Diagnosis:    ICD-10-CM   1. 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 CUSTOM BOLUS WAS PLACED ON FACE. Bite block placed.  An Aquaplast head and shoulder mask was custom fitted to the patient's anatomy. High-resolution CT axial imaging was obtained of the head and neck with contrast. I verified that the quality of the imaging is good for treatment planning. 2 Medically Necessary Treatment Device was fabricated and supervised by me: Aquaplast mask and CUSTOM BOLUS.  Treatment planning note I plan to treat the patient with IMRT. I plan to treat the patient's tumor bed to a total dose of 60 Gray in 30  fractions. Dose calculation was ordered from dosimetry.  IMRT planning Note  IMRT is medically necessary and an important modality to deliver adequate dose to the patient's at risk tissues while sparing the patient's normal structures, including the: esophagus, parotid tissue, mandible, oral cavity, eyes. This justifies the use of IMRT in the patient's treatment.      -----------------------------------  Eppie Gibson, MD

## 2017-04-08 ENCOUNTER — Encounter: Payer: Self-pay | Admitting: General Practice

## 2017-04-08 NOTE — Progress Notes (Signed)
Oncology Nurse Navigator Documentation  To provide support, encouragement and care continuity, met with Mr. Hinners during his CT SIM.  He was accompanied by his wife.   He tolerated procedure without difficulty, denied questions/concerns.  I toured them to [facility]  treatment area, explained procedures for registration, arrival to Radiation Waiting, arrival to tmt area and preparation for tmt.  They voiced understanding.  I encouraged them to call me prior to 12/6 [facility] with questions.  Gayleen Orem, RN, BSN, [place] Neck Oncology Nurse [place] at [place] [phone]'

## 2017-04-08 NOTE — Progress Notes (Signed)
[place] Psychosocial Distress Screening Clinical Social Work  Clinical Social Work was referred by distress screening protocol.  The patient scored a 8 on the Psychosocial Distress Thermometer which indicates severe distress. Clinical Social Worker Edwyna Shell to assess for distress and other psychosocial needs. Patient reports significant anxiety which has been treated w Lexapro by PCP; "going through a lot and getting ready to go through radiation."  Has not been able to work much due to frequent surgeries, fatigue.  Financial stress from illness and general stress.  "More stressed out about my kids and wife - I feel that responsibility.  My 42 year old son has probably had a toll taken on him, its been rough to see your father go through all this."  "Finding the time has been difficult."  Stress has come from wife needing to take over parental responsibilities that patient used to do as patient is too fatigued to assist."  Lives w wife, 38 yo son, 29 yo daughter.  Concerned about impact of radiation on patient's ability to fully participate in holiday season, "Its been a rough year, now a rough holiday season - I'm worried about my family and how they will handle it."  Daughter is CNA/nursing student "she has been my caregiver."  Major concern is impact of treatment on patient's son and wife, would like support for both.  CSW offered various options including [facility], Areta Haber, information on support center resources and information on cancer support for caregivers.  Patient expressed appreciation.    ONCBCN DISTRESS SCREENING 03/31/2017  Screening Type Initial Screening  Distress experienced in past week (1-10) 8  Practical problem type Work/school  Family Problem type Partner;Children  Emotional problem type Depression;Nervousness/Anxiety;Adjusting to illness;Isolation/feeling alone;Boredom;Adjusting to appearance changes  Information Concerns Type Lack of info about treatment  Physical Problem  type Pain;Sleep/insomnia;Mouth sores/swallowing;Talking;Tingling hands/feet;Sexual problems;Skin dry/itchy    Clinical Social Worker follow up needed: Yes.    Counseling offered.  If yes, follow up plan:  Patient requests CSW contact wife Levada Dy at [phone] to assess needs for support   Edwyna Shell, [place] Worker Phone:  [phone]

## 2017-04-14 DIAGNOSIS — Z510 Encounter for antineoplastic radiation therapy: Secondary | ICD-10-CM | POA: Diagnosis not present

## 2017-04-15 ENCOUNTER — Ambulatory Visit: Payer: PRIVATE HEALTH INSURANCE

## 2017-04-15 ENCOUNTER — Encounter: Payer: Self-pay | Admitting: *Deleted

## 2017-04-15 ENCOUNTER — Ambulatory Visit
Admission: RE | Admit: 2017-04-15 | Discharge: 2017-04-15 | Disposition: A | Discharge status: Home / Self Care | Payer: PRIVATE HEALTH INSURANCE | Source: Ambulatory Visit | Attending: Radiation Oncology | Admitting: Radiation Oncology

## 2017-04-15 DIAGNOSIS — Z510 Encounter for antineoplastic radiation therapy: Secondary | ICD-10-CM | POA: Diagnosis not present

## 2017-04-15 NOTE — Progress Notes (Signed)
Oncology Nurse Navigator Documentation  Met with pt during initial RT. He reported tolerating without difficulty, denied questions/concerns. We further discussed his attendance at next Tuesday morning's H&N MDC, I provided MDC Information Sheet, he voiced understanding of 0800 arrival to Radiation Waiting following lobby registration.  [name] Diehl, RN, BSN, CHPN Head & Neck Oncology Nurse Navigator [facility] at [place] [phone]  

## 2017-04-16 ENCOUNTER — Encounter: Payer: Self-pay | Admitting: Radiation Oncology

## 2017-04-16 ENCOUNTER — Ambulatory Visit
Admission: RE | Admit: 2017-04-16 | Discharge: 2017-04-16 | Disposition: A | Discharge status: Home / Self Care | Payer: PRIVATE HEALTH INSURANCE | Source: Ambulatory Visit | Attending: Radiation Oncology | Admitting: Radiation Oncology

## 2017-04-16 DIAGNOSIS — Z510 Encounter for antineoplastic radiation therapy: Secondary | ICD-10-CM | POA: Diagnosis not present

## 2017-04-16 NOTE — Progress Notes (Signed)
Micheal Faulkner dropped off paperwork for FMLA. I spoke with him and he said he will pick it up, when completed, at the time of his treatment. He wanted to be connected to [name] about his appt on Monday. Connected to [name]'s line. Paperwork is on cart to give to nursing.

## 2017-04-19 ENCOUNTER — Ambulatory Visit: Payer: PRIVATE HEALTH INSURANCE

## 2017-04-19 ENCOUNTER — Telehealth: Payer: Self-pay | Admitting: *Deleted

## 2017-04-19 NOTE — Telephone Encounter (Signed)
Oncology Nurse Navigator Documentation  Called pt to inform tomorrow's H&N MDC is cancelled d/t weather.  He voiced understanding appts with Nutrition, PT, SW and FC will be rescheduled.  Gayleen Orem, RN, BSN, [place] Neck Oncology Nurse [place] at [place] [phone]

## 2017-04-20 ENCOUNTER — Ambulatory Visit: Payer: PRIVATE HEALTH INSURANCE

## 2017-04-20 ENCOUNTER — Encounter: Payer: PRIVATE HEALTH INSURANCE | Admitting: Nutrition

## 2017-04-20 ENCOUNTER — Ambulatory Visit: Payer: PRIVATE HEALTH INSURANCE | Attending: Radiation Oncology | Admitting: Physical Therapy

## 2017-04-21 ENCOUNTER — Ambulatory Visit
Admission: RE | Admit: 2017-04-21 | Discharge: 2017-04-21 | Disposition: A | Discharge status: Home / Self Care | Payer: PRIVATE HEALTH INSURANCE | Source: Ambulatory Visit | Attending: Radiation Oncology | Admitting: Radiation Oncology

## 2017-04-21 ENCOUNTER — Encounter: Payer: Self-pay | Admitting: Radiation Oncology

## 2017-04-21 DIAGNOSIS — Z510 Encounter for antineoplastic radiation therapy: Secondary | ICD-10-CM | POA: Diagnosis not present

## 2017-04-21 NOTE — Progress Notes (Signed)
Micheal Faulkner came by to see if his paperwork is ready. Anderson Malta RN states that she has given it to Dr. Isidore Moos.

## 2017-04-22 ENCOUNTER — Encounter: Payer: Self-pay | Admitting: Radiation Oncology

## 2017-04-22 ENCOUNTER — Ambulatory Visit
Admission: RE | Admit: 2017-04-22 | Discharge: 2017-04-22 | Disposition: A | Discharge status: Home / Self Care | Payer: PRIVATE HEALTH INSURANCE | Source: Ambulatory Visit | Attending: Radiation Oncology | Admitting: Radiation Oncology

## 2017-04-22 DIAGNOSIS — Z510 Encounter for antineoplastic radiation therapy: Secondary | ICD-10-CM | POA: Diagnosis not present

## 2017-04-22 NOTE — Progress Notes (Signed)
Pt here for patient teaching.  Pt given Radiation and You booklet, Managing Acute Radiation Side Effects for Head and Neck Cancer handout, skin care instructions and Sonafine.  Reviewed areas of pertinence such as fatigue, mouth changes, skin changes, throat changes and taste changes . Pt able to give teach back of to pat skin and use unscented/gentle soap,apply Sonafine bid and avoid applying anything to skin within 4 hours of treatment. Pt verbalizes understanding of information given and will contact nursing with any questions or concerns.     Http://rtanswers.org/treatmentinformation/whattoexpect/index

## 2017-04-22 NOTE — Progress Notes (Signed)
FMLA is completed by Dr. Isidore Moos and is left at L3 for Mr. Smithey to p/u. I called and left him a message relaying this. Copies are scanned for pt chart.

## 2017-04-23 ENCOUNTER — Ambulatory Visit
Admission: RE | Admit: 2017-04-23 | Discharge: 2017-04-23 | Disposition: A | Discharge status: Home / Self Care | Payer: PRIVATE HEALTH INSURANCE | Source: Ambulatory Visit | Attending: Radiation Oncology | Admitting: Radiation Oncology

## 2017-04-23 DIAGNOSIS — Z510 Encounter for antineoplastic radiation therapy: Secondary | ICD-10-CM | POA: Diagnosis not present

## 2017-04-24 ENCOUNTER — Ambulatory Visit
Admission: RE | Admit: 2017-04-24 | Discharge: 2017-04-24 | Disposition: A | Discharge status: Home / Self Care | Payer: PRIVATE HEALTH INSURANCE | Source: Ambulatory Visit | Attending: Radiation Oncology | Admitting: Radiation Oncology

## 2017-04-24 DIAGNOSIS — Z510 Encounter for antineoplastic radiation therapy: Secondary | ICD-10-CM | POA: Diagnosis not present

## 2017-04-26 ENCOUNTER — Telehealth: Payer: Self-pay

## 2017-04-26 ENCOUNTER — Ambulatory Visit
Admission: RE | Admit: 2017-04-26 | Discharge: 2017-04-26 | Disposition: A | Discharge status: Home / Self Care | Payer: PRIVATE HEALTH INSURANCE | Source: Ambulatory Visit | Attending: Radiation Oncology | Admitting: Radiation Oncology

## 2017-04-26 ENCOUNTER — Other Ambulatory Visit: Payer: Self-pay | Admitting: Radiation Oncology

## 2017-04-26 ENCOUNTER — Ambulatory Visit (HOSPITAL_COMMUNITY): Payer: Self-pay | Admitting: Dentistry

## 2017-04-26 DIAGNOSIS — H539 Unspecified visual disturbance: Secondary | ICD-10-CM

## 2017-04-26 DIAGNOSIS — Z510 Encounter for antineoplastic radiation therapy: Secondary | ICD-10-CM | POA: Diagnosis not present

## 2017-04-26 DIAGNOSIS — C300 Malignant neoplasm of nasal cavity: Secondary | ICD-10-CM

## 2017-04-26 MED ORDER — LIDOCAINE VISCOUS 2 % MT SOLN: OROMUCOSAL | 5 refills | Status: AC

## 2017-04-26 MED FILL — LIDOCAINE 2% VISCOUS SOLN: 2 | 2 days supply | Qty: 100 | Fill #0

## 2017-04-26 NOTE — Telephone Encounter (Signed)
Spoke with patient and he is aware of his new NUT appt  Micheal Faulkner

## 2017-04-27 ENCOUNTER — Encounter (HOSPITAL_COMMUNITY): Payer: Self-pay | Admitting: Dentistry

## 2017-04-27 ENCOUNTER — Telehealth: Payer: Self-pay | Admitting: *Deleted

## 2017-04-27 ENCOUNTER — Ambulatory Visit (HOSPITAL_COMMUNITY): Payer: Self-pay | Admitting: Dentistry

## 2017-04-27 ENCOUNTER — Ambulatory Visit
Admission: RE | Admit: 2017-04-27 | Discharge: 2017-04-27 | Disposition: A | Discharge status: Home / Self Care | Payer: PRIVATE HEALTH INSURANCE | Source: Ambulatory Visit | Attending: Radiation Oncology | Admitting: Radiation Oncology

## 2017-04-27 VITALS — BP 125/78 | HR 52 | Temp 97.7°F | Wt 166.0 lb

## 2017-04-27 DIAGNOSIS — Z510 Encounter for antineoplastic radiation therapy: Secondary | ICD-10-CM | POA: Diagnosis not present

## 2017-04-27 DIAGNOSIS — C300 Malignant neoplasm of nasal cavity: Secondary | ICD-10-CM

## 2017-04-27 DIAGNOSIS — R682 Dry mouth, unspecified: Secondary | ICD-10-CM

## 2017-04-27 DIAGNOSIS — K117 Disturbances of salivary secretion: Secondary | ICD-10-CM

## 2017-04-27 DIAGNOSIS — R432 Parageusia: Secondary | ICD-10-CM

## 2017-04-27 NOTE — Progress Notes (Signed)
04/27/2017  Patient Name:   Sarvesh Meddaugh Date of Birth:   Aug 02, 1970 Medical Record Number: [id number]  BP 125/78 (BP Location: Left Arm)   Pulse (!) 52   Temp 97.7 F (36.5 C) (Oral)   Wt 166 lb (75.3 kg)   BMI 24.51 kg/m   [name] presents for oral examination during radiation therapy. Patient has completed 7/30 radiation treatments.  REVIEW OF CHIEF COMPLAINTS: DRY MOUTH: No HARD TO SWALLOW: No  HURT TO SWALLOW: No TASTE CHANGES: Yes. Slight taste changes. SORES IN MOUTH: No TRISMUS: No symptoms. WEIGHT: 166 lbs. Weight gain.  HOME OH REGIMEN:  BRUSHING: Twice a day. FLOSSING: Once daily RINSING: Biotene rinses. FLUORIDE: Using at bedtime. TRISMUS EXERCISES:  Maximum interincisal opening: 40 mm   DENTAL EXAM:  Oral Hygiene:(PLAQUE): Good oral hygiene. LOCATION OF MUCOSITIS: None noted. DESCRIPTION OF SALIVA:Incipeint xerostomia. Foamy saliva. ANY EXPOSED BONE: None noted. OTHER WATCHED AREAS: Teeth in primary field of XRT. DX: Xerostomia and Dysgeusia  RECOMMENDATIONS: 1. Brush after meals and at bedtime.  Use fluoride at bedtime. 2. Use trismus exercises as directed. 3. Use Biotene Rinse or salt water/baking soda rinses. 4. Multiple sips of water as needed. 5. Return to clinic in two months for oral exam after radiation therapy. Call if problems before then.  Lenn Cal, DDS

## 2017-04-27 NOTE — Patient Instructions (Signed)
RECOMMENDATIONS: 1. Brush after meals and at bedtime.  Use fluoride at bedtime. 2. Use trismus exercises as directed. 3. Use Biotene Rinse or salt water/baking soda rinses. 4. Multiple sips of water as needed. 5. Return to clinic in two months for oral exam after radiation therapy. Call if problems before then.  Lenn Cal, DDS

## 2017-04-27 NOTE — Telephone Encounter (Signed)
CALLED PATIENT TO INFORM OF APPT. WITH DR. Claudean Kinds ON 06-03-17- ARRIVAL TIME - 9:30 AM @ [address], PH. NO. [phone], SPOKE WITH PATIENT AND HE IS AWARE OF THIS APPT.

## 2017-04-28 ENCOUNTER — Telehealth: Payer: Self-pay | Admitting: *Deleted

## 2017-04-28 ENCOUNTER — Ambulatory Visit
Admission: RE | Admit: 2017-04-28 | Discharge: 2017-04-28 | Disposition: A | Discharge status: Home / Self Care | Payer: PRIVATE HEALTH INSURANCE | Source: Ambulatory Visit | Attending: Radiation Oncology | Admitting: Radiation Oncology

## 2017-04-28 DIAGNOSIS — Z510 Encounter for antineoplastic radiation therapy: Secondary | ICD-10-CM | POA: Diagnosis not present

## 2017-04-28 NOTE — Telephone Encounter (Signed)
Oncology Nurse Navigator Documentation  Spoke with pt, discussed FC and PT appts missed d/t cancellation of 12/11 MDC.  He voiced understanding he will be called by [place], by [facility] to schedule appt with Serafina Royals at [facility] location.  Gayleen Orem, RN, BSN, [place] Neck Oncology Nurse [place] at [place] [phone]

## 2017-04-29 ENCOUNTER — Ambulatory Visit
Admission: RE | Admit: 2017-04-29 | Discharge: 2017-04-29 | Disposition: A | Discharge status: Home / Self Care | Payer: PRIVATE HEALTH INSURANCE | Source: Ambulatory Visit | Attending: Radiation Oncology | Admitting: Radiation Oncology

## 2017-04-29 DIAGNOSIS — Z510 Encounter for antineoplastic radiation therapy: Secondary | ICD-10-CM | POA: Diagnosis not present

## 2017-04-30 ENCOUNTER — Ambulatory Visit: Payer: PRIVATE HEALTH INSURANCE | Admitting: Nutrition

## 2017-04-30 ENCOUNTER — Encounter: Payer: Self-pay | Admitting: Radiation Oncology

## 2017-04-30 ENCOUNTER — Ambulatory Visit
Admission: RE | Admit: 2017-04-30 | Discharge: 2017-04-30 | Disposition: A | Discharge status: Home / Self Care | Payer: PRIVATE HEALTH INSURANCE | Source: Ambulatory Visit | Attending: Radiation Oncology | Admitting: Radiation Oncology

## 2017-04-30 DIAGNOSIS — Z510 Encounter for antineoplastic radiation therapy: Secondary | ICD-10-CM | POA: Diagnosis not present

## 2017-04-30 NOTE — Progress Notes (Signed)
46 year old male diagnosed with nasal cavity cancer in August 2018. He is a patient of Dr. Isidore Moos.  Past medical history includes migraine, hyperlipidemia, allergies, asthma, tobacco, and alcohol.  Medications include Xanax, Celebrex, multivitamin, and Crestor.  Labs were reviewed.  Height: 69 inches. Weight: 166 pounds increased from 152 pounds per patient. Usual body weight: Approximately 190 pounds per patient. BMI: 24.51.  Patient is status post multiple surgeries and reconstruction. Patient overall feels as if he is doing well. He has a little bit of soreness in the roof of his mouth and cannot eat crunchy foods. He is consuming softer protein foods without difficulty.   Denies other nutrition impact symptoms.  Nutrition diagnosis: Food and nutrition related knowledge deficit related to nasal cavity cancer as evidenced by no prior need for nutrition related information.  Intervention: Patient educated to continue small frequent meals and snacks with high-calorie, high-protein foods for weight maintenance. Reviewed soft protein foods and provided fact sheet. Encouraged oral nutrition supplements if eating becomes more difficult. Questions answered.  Teach back method used.  Monitoring, evaluation, goals: Patient will tolerate adequate calories and protein to maintain weight.  Next visit: To be scheduled weekly.  **Disclaimer: This note was dictated with voice recognition software. Similar sounding words can inadvertently be transcribed and this note may contain transcription errors which may not have been corrected upon publication of note.**

## 2017-04-30 NOTE — Progress Notes (Signed)
Financial Counselor--called patient today--answered his questions regarding insurance and grants--he is over income for [employer] grant--told him to call me back if he had additional questions

## 2017-05-03 ENCOUNTER — Other Ambulatory Visit: Payer: Self-pay | Admitting: Radiation Oncology

## 2017-05-03 ENCOUNTER — Ambulatory Visit
Admission: RE | Admit: 2017-05-03 | Discharge: 2017-05-03 | Disposition: A | Discharge status: Home / Self Care | Payer: PRIVATE HEALTH INSURANCE | Source: Ambulatory Visit | Attending: Radiation Oncology | Admitting: Radiation Oncology

## 2017-05-03 DIAGNOSIS — Z510 Encounter for antineoplastic radiation therapy: Secondary | ICD-10-CM | POA: Diagnosis not present

## 2017-05-05 ENCOUNTER — Ambulatory Visit
Admission: RE | Admit: 2017-05-05 | Discharge: 2017-05-05 | Disposition: A | Discharge status: Home / Self Care | Payer: PRIVATE HEALTH INSURANCE | Source: Ambulatory Visit | Attending: Radiation Oncology | Admitting: Radiation Oncology

## 2017-05-05 DIAGNOSIS — Z510 Encounter for antineoplastic radiation therapy: Secondary | ICD-10-CM | POA: Diagnosis not present

## 2017-05-06 ENCOUNTER — Ambulatory Visit
Admission: RE | Admit: 2017-05-06 | Discharge: 2017-05-06 | Disposition: A | Discharge status: Home / Self Care | Payer: PRIVATE HEALTH INSURANCE | Source: Ambulatory Visit | Attending: Radiation Oncology | Admitting: Radiation Oncology

## 2017-05-06 DIAGNOSIS — Z510 Encounter for antineoplastic radiation therapy: Secondary | ICD-10-CM | POA: Diagnosis not present

## 2017-05-07 ENCOUNTER — Ambulatory Visit
Admission: RE | Admit: 2017-05-07 | Discharge: 2017-05-07 | Disposition: A | Discharge status: Home / Self Care | Payer: PRIVATE HEALTH INSURANCE | Source: Ambulatory Visit | Attending: Radiation Oncology | Admitting: Radiation Oncology

## 2017-05-07 DIAGNOSIS — Z510 Encounter for antineoplastic radiation therapy: Secondary | ICD-10-CM | POA: Diagnosis not present

## 2017-05-10 ENCOUNTER — Other Ambulatory Visit: Payer: Self-pay | Admitting: Radiation Oncology

## 2017-05-10 ENCOUNTER — Ambulatory Visit
Admission: RE | Admit: 2017-05-10 | Discharge: 2017-05-10 | Disposition: A | Discharge status: Home / Self Care | Payer: PRIVATE HEALTH INSURANCE | Source: Ambulatory Visit | Attending: Radiation Oncology | Admitting: Radiation Oncology

## 2017-05-10 DIAGNOSIS — Z510 Encounter for antineoplastic radiation therapy: Secondary | ICD-10-CM | POA: Diagnosis not present

## 2017-05-10 MED ORDER — HYDROCODONE-ACETAMINOPHEN 5-325 MG PO TABS: 1.0000 | ORAL_TABLET | Freq: Four times a day (QID) | ORAL | 0 refills | Status: AC | PRN

## 2017-05-11 DIAGNOSIS — Z510 Encounter for antineoplastic radiation therapy: Secondary | ICD-10-CM | POA: Diagnosis present

## 2017-05-11 DIAGNOSIS — E785 Hyperlipidemia, unspecified: Secondary | ICD-10-CM | POA: Diagnosis not present

## 2017-05-11 DIAGNOSIS — C300 Malignant neoplasm of nasal cavity: Secondary | ICD-10-CM | POA: Diagnosis not present

## 2017-05-11 DIAGNOSIS — G43909 Migraine, unspecified, not intractable, without status migrainosus: Secondary | ICD-10-CM | POA: Diagnosis not present

## 2017-05-12 ENCOUNTER — Ambulatory Visit
Admission: RE | Admit: 2017-05-12 | Discharge: 2017-05-12 | Disposition: A | Discharge status: Home / Self Care | Payer: PRIVATE HEALTH INSURANCE | Source: Ambulatory Visit | Attending: Radiation Oncology | Admitting: Radiation Oncology

## 2017-05-12 DIAGNOSIS — Z510 Encounter for antineoplastic radiation therapy: Secondary | ICD-10-CM | POA: Diagnosis not present

## 2017-05-13 ENCOUNTER — Ambulatory Visit
Admission: RE | Admit: 2017-05-13 | Discharge: 2017-05-13 | Disposition: A | Discharge status: Home / Self Care | Payer: PRIVATE HEALTH INSURANCE | Source: Ambulatory Visit | Attending: Radiation Oncology | Admitting: Radiation Oncology

## 2017-05-13 ENCOUNTER — Encounter: Payer: Self-pay | Admitting: *Deleted

## 2017-05-13 DIAGNOSIS — Z510 Encounter for antineoplastic radiation therapy: Secondary | ICD-10-CM | POA: Diagnosis not present

## 2017-05-14 ENCOUNTER — Encounter: Payer: Self-pay | Admitting: *Deleted

## 2017-05-14 ENCOUNTER — Ambulatory Visit
Admission: RE | Admit: 2017-05-14 | Discharge: 2017-05-14 | Disposition: A | Discharge status: Home / Self Care | Payer: PRIVATE HEALTH INSURANCE | Source: Ambulatory Visit | Attending: Radiation Oncology | Admitting: Radiation Oncology

## 2017-05-14 ENCOUNTER — Ambulatory Visit: Payer: PRIVATE HEALTH INSURANCE | Admitting: Nutrition

## 2017-05-14 DIAGNOSIS — Z510 Encounter for antineoplastic radiation therapy: Secondary | ICD-10-CM | POA: Diagnosis not present

## 2017-05-14 NOTE — Progress Notes (Signed)
Oncology Nurse Navigator Documentation  Met with pt prior to RT appt.  He agreed to reschedule 1/9 4:00 PT appt for 1/8 MDC.  I provided 1000 arrival, explained he will see PT prior to 1100 RT.  He voiced understanding.  PT scheduler Darlyn Chamber notified.  Gayleen Orem, RN, BSN Head & Neck Oncology Nurse [place] at [place] [phone]

## 2017-05-14 NOTE — Progress Notes (Signed)
Nutrition follow-up completed with patient receiving radiation therapy for nasal cavity cancer. Patient reports fatigue, taste alterations, and mouth sores. He denies nausea, vomiting, constipation, and diarrhea. Weight decreased and documented as 164.8 pounds January 4, down from 166 pounds December 18. Patient is eating a variety of soft foods throughout the day.  He does not really eat meals. Diet history reveals patient able to consume good protein sources. He has not started to use oral nutrition supplements.  Nutrition diagnosis: Food and nutrition related knowledge deficit continues.  Intervention: Patient educated to continue small meals/snacks throughout the day utilizing high-calorie, high-protein foods. Recommended patient begin oral nutrition supplements for additional calories and protein. Educated patient on strategies to improve taste alterations and mouth sores. Questions were answered.  Teach back method used.  Monitoring, evaluation, goals: Patient will tolerate adequate calories and protein for weight maintenance.  Next visit: Friday, January 11.  **Disclaimer: This note was dictated with voice recognition software. Similar sounding words can inadvertently be transcribed and this note may contain transcription errors which may not have been corrected upon publication of note.**

## 2017-05-14 NOTE — Progress Notes (Signed)
Oncology Nurse Navigator Documentation  Met with Micheal Faulkner prior to RT to check on his well-being.  He stated tolerating tmts without difficulty, experiencing some skin tenderness in tmt area, some difficulty eating but managing.  He denied needs/concerns.  I encouraged him to contact me should that change.  Gayleen Orem, RN, BSN, [place] Neck Oncology Nurse [place] at [place] [phone]

## 2017-05-17 ENCOUNTER — Ambulatory Visit
Admission: RE | Admit: 2017-05-17 | Discharge: 2017-05-17 | Disposition: A | Discharge status: Home / Self Care | Payer: PRIVATE HEALTH INSURANCE | Source: Ambulatory Visit | Attending: Radiation Oncology | Admitting: Radiation Oncology

## 2017-05-17 DIAGNOSIS — Z510 Encounter for antineoplastic radiation therapy: Secondary | ICD-10-CM | POA: Diagnosis not present

## 2017-05-18 ENCOUNTER — Ambulatory Visit
Admission: RE | Admit: 2017-05-18 | Discharge: 2017-05-18 | Disposition: A | Discharge status: Home / Self Care | Payer: PRIVATE HEALTH INSURANCE | Source: Ambulatory Visit | Attending: Radiation Oncology | Admitting: Radiation Oncology

## 2017-05-18 ENCOUNTER — Encounter: Payer: Self-pay | Admitting: *Deleted

## 2017-05-18 ENCOUNTER — Ambulatory Visit: Payer: Self-pay | Admitting: Physical Therapy

## 2017-05-18 ENCOUNTER — Ambulatory Visit: Payer: PRIVATE HEALTH INSURANCE | Attending: Radiation Oncology | Admitting: Physical Therapy

## 2017-05-18 DIAGNOSIS — M6281 Muscle weakness (generalized): Secondary | ICD-10-CM | POA: Diagnosis present

## 2017-05-18 DIAGNOSIS — Z510 Encounter for antineoplastic radiation therapy: Secondary | ICD-10-CM | POA: Diagnosis not present

## 2017-05-18 DIAGNOSIS — Z483 Aftercare following surgery for neoplasm: Secondary | ICD-10-CM | POA: Diagnosis not present

## 2017-05-18 DIAGNOSIS — C300 Malignant neoplasm of nasal cavity: Secondary | ICD-10-CM | POA: Insufficient documentation

## 2017-05-18 NOTE — Therapy (Signed)
[place], [place], [address] Phone: [phone]   Fax:  [phone]  Physical Therapy Evaluation  Patient Details  Name: Micheal Faulkner MRN: [id number] Date of Birth: 12/04/1970 Referring Provider: Dr. Eppie Gibson   Encounter Date: 05/18/2017  PT End of Session - 05/18/17 1237    Visit Number  1    Number of Visits  1    PT Start Time  0953    PT Stop Time  1015    PT Time Calculation (min)  22 min    Activity Tolerance  Patient tolerated treatment well    Behavior During Therapy  [facility] for tasks assessed/performed       Past Medical History:  Diagnosis Date  . Asthma   . Epistaxis, recurrent 12/2016  . H/O seasonal allergies   . Hyperlipidemia   . Migraine   . Nasal mass 12/2016    Past Surgical History:  Procedure Laterality Date  . FREE FLAP RADIAL FOREARM  02/11/2017  . nasolabial flap  02/11/2017  . posterior septectomy  01/19/2017    Posterior Septectomy, Marijo Conception MD [facility].   Marland Kitchen RHINECTOMY  01/19/2017   Rhinectomy partial, surgeon, Marijo Conception MD at [facility].   . rib graft harvest  02/11/2017  . rotation forehead flap  02/11/2017  . VASECTOMY      There were no vitals filed for this visit.   Subjective Assessment - 05/18/17 1222    Subjective  I have 10 treatments left, then a lot of surgeries. I have to clean these stents a lot since I started radiation--every hour or hour and a half.    Patient is accompained by:  Family member daughter who is a Quarry manager, Customer service manager, in nursing school    Pertinent History  Diagnosed with nasal cavity squamous cell carcinoma. Underwent surgery [facility] 01/19/2017 (partial rhinectomy and right medial maxillectomy with reconstruction of nose with forearm flaps and rib graft). Right neck dissection with 16 nodes removed, all negative. Currently using nasal stents. He will other 3-5 more reconstruction surgeries. Stopped smoking and alcohol at diagnosis,  01/03/17.    Patient Stated Goals  get info about cancer rehab    Currently in Pain?  Yes    Pain Score  4     Pain Location  Face and right jaw, mouth sores    Pain Descriptors / Indicators  Dull    Pain Onset  1 to 4 weeks ago    Pain Frequency  Constant    Aggravating Factors   radiation treatment caused pain    Pain Relieving Factors  hydrocodone         [facility] PT Assessment - 05/18/17 0001      Assessment   Medical Diagnosis  nasal cavity squamous cell carcinoma    Referring Provider  Dr. Eppie Gibson    Onset Date/Surgical Date  01/05/17 surgery 01/19/17    Hand Dominance  Left    Prior Therapy  none      Precautions   Precautions  Other (comment)    Precaution Comments  cancer precautions    Required Braces or Orthoses  Other Brace/Splint nasal stents      Restrictions   Weight Bearing Restrictions  No      Balance Screen   Has the patient fallen in the past 6 months  No    Has the patient had a decrease in activity level because of a fear of falling?  No    Is the patient reluctant to leave their home because of a fear of falling?   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      Cognition   Overall Cognitive Status  Within Functional Limits for tasks assessed      Observation/Other Assessments   Observations  gentleman with facial scarring and mild swelling at site of nasal reconstruction; nasal stents visible; right forearm graft donor site scars; right neck dissection scar approx. 6 inches long, healed      Functional Tests    Functional tests  Sit to Stand      Sit to Stand   Comments  13 times in 30 seconds, below average for age; mild-mod dyspnea following, and reported sore legs as a result      Posture/Postural Control   Posture/Postural Control  Postural limitations    Postural Limitations  Forward head      ROM / Strength   AROM / PROM / Strength  AROM      AROM   Overall AROM Comments  neck and bilat. shoulder AROM both [facility]      Ambulation/Gait   Ambulation/Gait  Yes    Ambulation/Gait Assistance  7: Independent        LYMPHEDEMA/ONCOLOGY QUESTIONNAIRE - 05/18/17 1234      Type   Cancer Type  nasal cavity SCC      Surgeries   Other Surgery Date  01/19/17    Number Lymph Nodes Removed  16      Treatment   Active Radiation Treatment  Yes    Date  04/15/17    Body Site  face      Lymphedema Assessments   Lymphedema Assessments  Head and Neck      Head and Neck   4 cm superior to sternal notch around neck  39.1 cm    6 cm superior to sternal notch around neck  39 cm    8 cm superior to sternal notch around neck  39.7 cm    Other  41.2 at 10 cm. superior to sternal notch          Objective measurements completed on examination: See above findings.              PT Education - 05/18/17 1237    Education provided  Yes    Education Details  neck ROM, posture, breathing, walking, CURE article on staying active, "Why exercise?" flyer, lymphedema and PT info    Person(s) Educated  Patient;Child(ren)    Methods  Explanation;Handout    Comprehension  Verbalized understanding              Head and Neck Clinic Goals - 05/18/17 1242      Patient will be able to verbalize understanding of a home exercise program for cervical range of motion, posture, and walking.    Status  Achieved      Patient will be able to verbalize understanding of  proper sitting and standing posture.    Status  Achieved      Patient will be able to verbalize understanding of  lymphedema risk and availability of treatment for this condition.    Status  Achieved         Plan - 05/18/17 1238    Clinical Impression Statement  Pleasant gentleman who has had surgery to remove nasal cavity tumor and neck dissection with reconstruction of nose; now approx. 2/3 through with XRT.  He seems to be doing well but does have pain and some decreased activity tolerance for his age.    Clinical Presentation  Evolving    Clinical Presentation due to:  finishing up XRT and will have further reconstruction    Clinical Decision Making  Low    Rehab Potential  Excellent    Clinical Impairments Affecting Rehab Potential  will have several reconstruction surgeries    PT Frequency  One time visit    PT Treatment/Interventions  Patient/family education    PT Next Visit Plan  None planned at this time; will need follow-up if lymphedema develops    PT Home Exercise Plan  neck ROM, walking or other exercise    Consulted and Agree with Plan of Care  Patient       Patient will benefit from skilled therapeutic intervention in order to improve the following deficits and impairments:  Decreased activity tolerance, Decreased mobility  Visit Diagnosis: Aftercare following surgery for neoplasm - Plan: PT plan of care cert/re-cert  Muscle weakness (generalized) - Plan: PT plan of care cert/re-cert  Malignant neoplasm of nasal cavity ([place]) - Plan: PT plan of care cert/re-cert     Problem List Patient Active Problem List   Diagnosis Date Noted  . Squamous cell carcinoma of nasal cavity ([place]) 03/31/2017    Dairon Procter 05/18/2017, 12:44 PM  [place], [place], [address] Phone: [phone]   Fax:  [phone]  Name: Lattie Cervi MRN: [id number] Date of Birth: [birth year]  Serafina Royals, PT 05/18/17 12:45 PM

## 2017-05-18 NOTE — Progress Notes (Signed)
Met with Mr. Llanas during H&N [place].  He was accompanied by his dtr.  Provided verbal overview of [place], the clinicians who will be seeing him, encouraged him to ask questions during his time with them.  He was seen by PT.   He proceeded to RT following MDC.  Gayleen Orem, RN, BSN Head & Neck Oncology [place] at [place] [phone]

## 2017-05-19 ENCOUNTER — Ambulatory Visit: Payer: Self-pay | Admitting: Physical Therapy

## 2017-05-19 ENCOUNTER — Ambulatory Visit
Admission: RE | Admit: 2017-05-19 | Discharge: 2017-05-19 | Disposition: A | Discharge status: Home / Self Care | Payer: PRIVATE HEALTH INSURANCE | Source: Ambulatory Visit | Attending: Radiation Oncology | Admitting: Radiation Oncology

## 2017-05-19 DIAGNOSIS — Z510 Encounter for antineoplastic radiation therapy: Secondary | ICD-10-CM | POA: Diagnosis not present

## 2017-05-20 ENCOUNTER — Ambulatory Visit
Admission: RE | Admit: 2017-05-20 | Discharge: 2017-05-20 | Disposition: A | Discharge status: Home / Self Care | Payer: PRIVATE HEALTH INSURANCE | Source: Ambulatory Visit | Attending: Radiation Oncology | Admitting: Radiation Oncology

## 2017-05-20 DIAGNOSIS — Z510 Encounter for antineoplastic radiation therapy: Secondary | ICD-10-CM | POA: Diagnosis not present

## 2017-05-21 ENCOUNTER — Ambulatory Visit
Admission: RE | Admit: 2017-05-21 | Discharge: 2017-05-21 | Disposition: A | Discharge status: Home / Self Care | Payer: PRIVATE HEALTH INSURANCE | Source: Ambulatory Visit | Attending: Radiation Oncology | Admitting: Radiation Oncology

## 2017-05-21 ENCOUNTER — Ambulatory Visit: Payer: PRIVATE HEALTH INSURANCE | Admitting: Nutrition

## 2017-05-21 DIAGNOSIS — Z510 Encounter for antineoplastic radiation therapy: Secondary | ICD-10-CM | POA: Diagnosis not present

## 2017-05-21 NOTE — Progress Notes (Signed)
Nutrition follow-up with patient after radiation therapy for nasal cavity cancer. He continues to have taste alterations. He denies nausea, vomiting, constipation, and diarrhea. Weight documented as 164 pounds today stable from 164.8 pounds January 4. Patient continues to eat frequently throughout the day consuming good protein sources.  Nutrition diagnosis: Food and nutrition related knowledge deficit improved.  Intervention: Educated patient to continue strategies for adequate calories and protein intake. Discouraged weight loss. Provided oral nutrition supplement samples, and educated patient on recipes. Questions were answered.  Teach back method used.  Monitoring, evaluation, goals: Patient will tolerate adequate calories and protein for weight maintenance.  Next visit: Monday, January 21.  After radiation therapy.  **Disclaimer: This note was dictated with voice recognition software. Similar sounding words can inadvertently be transcribed and this note may contain transcription errors which may not have been corrected upon publication of note.**

## 2017-05-24 ENCOUNTER — Ambulatory Visit
Admission: RE | Admit: 2017-05-24 | Discharge: 2017-05-24 | Disposition: A | Discharge status: Home / Self Care | Payer: PRIVATE HEALTH INSURANCE | Source: Ambulatory Visit | Attending: Radiation Oncology | Admitting: Radiation Oncology

## 2017-05-24 DIAGNOSIS — Z510 Encounter for antineoplastic radiation therapy: Secondary | ICD-10-CM | POA: Diagnosis not present

## 2017-05-24 DIAGNOSIS — C300 Malignant neoplasm of nasal cavity: Secondary | ICD-10-CM

## 2017-05-24 MED ORDER — SONAFINE EX EMUL
1.0000 "application " | Freq: Two times a day (BID) | CUTANEOUS | Status: DC
  Administered 2017-05-24: 1 via TOPICAL

## 2017-05-25 ENCOUNTER — Encounter: Payer: Self-pay | Admitting: Nutrition

## 2017-05-25 ENCOUNTER — Ambulatory Visit
Admission: RE | Admit: 2017-05-25 | Discharge: 2017-05-25 | Disposition: A | Discharge status: Home / Self Care | Payer: PRIVATE HEALTH INSURANCE | Source: Ambulatory Visit | Attending: Radiation Oncology | Admitting: Radiation Oncology

## 2017-05-25 DIAGNOSIS — Z510 Encounter for antineoplastic radiation therapy: Secondary | ICD-10-CM | POA: Diagnosis not present

## 2017-05-26 ENCOUNTER — Ambulatory Visit
Admission: RE | Admit: 2017-05-26 | Discharge: 2017-05-26 | Disposition: A | Discharge status: Home / Self Care | Payer: PRIVATE HEALTH INSURANCE | Source: Ambulatory Visit | Attending: Radiation Oncology | Admitting: Radiation Oncology

## 2017-05-26 DIAGNOSIS — Z510 Encounter for antineoplastic radiation therapy: Secondary | ICD-10-CM | POA: Diagnosis not present

## 2017-05-27 ENCOUNTER — Ambulatory Visit
Admission: RE | Admit: 2017-05-27 | Discharge: 2017-05-27 | Disposition: A | Discharge status: Home / Self Care | Payer: PRIVATE HEALTH INSURANCE | Source: Ambulatory Visit | Attending: Radiation Oncology | Admitting: Radiation Oncology

## 2017-05-27 DIAGNOSIS — Z510 Encounter for antineoplastic radiation therapy: Secondary | ICD-10-CM | POA: Diagnosis not present

## 2017-05-28 ENCOUNTER — Ambulatory Visit: Payer: PRIVATE HEALTH INSURANCE

## 2017-05-28 ENCOUNTER — Ambulatory Visit
Admission: RE | Admit: 2017-05-28 | Discharge: 2017-05-28 | Disposition: A | Discharge status: Home / Self Care | Payer: PRIVATE HEALTH INSURANCE | Source: Ambulatory Visit | Attending: Radiation Oncology | Admitting: Radiation Oncology

## 2017-05-28 DIAGNOSIS — Z510 Encounter for antineoplastic radiation therapy: Secondary | ICD-10-CM | POA: Diagnosis not present

## 2017-05-31 ENCOUNTER — Encounter: Payer: Self-pay | Admitting: Radiation Oncology

## 2017-05-31 ENCOUNTER — Ambulatory Visit: Payer: PRIVATE HEALTH INSURANCE

## 2017-05-31 ENCOUNTER — Ambulatory Visit: Payer: PRIVATE HEALTH INSURANCE | Admitting: Nutrition

## 2017-05-31 ENCOUNTER — Encounter: Payer: Self-pay | Admitting: *Deleted

## 2017-05-31 ENCOUNTER — Ambulatory Visit
Admission: RE | Admit: 2017-05-31 | Discharge: 2017-05-31 | Disposition: A | Discharge status: Home / Self Care | Payer: PRIVATE HEALTH INSURANCE | Source: Ambulatory Visit | Attending: Radiation Oncology | Admitting: Radiation Oncology

## 2017-05-31 DIAGNOSIS — Z510 Encounter for antineoplastic radiation therapy: Secondary | ICD-10-CM | POA: Diagnosis not present

## 2017-05-31 NOTE — Progress Notes (Signed)
Nutrition follow-up completed with patient after his final radiation therapy for nasal cavity cancer. Weight stable at 164.6 pounds. Patient continues to eat soft foods frequently throughout the day. He has no questions or concerns at this time.  Nutrition diagnosis: Food and nutrition related knowledge deficit resolved.  I have encouraged patient to continue strategies for adequate calories and protein intake so as not to have any weight loss.  Patient has my contact information and will call me with questions or concerns.  **Disclaimer: This note was dictated with voice recognition software. Similar sounding words can inadvertently be transcribed and this note may contain transcription errors which may not have been corrected upon publication of note.**

## 2017-05-31 NOTE — Progress Notes (Signed)
Oncology Nurse Navigator Documentation  Met with Mr. Surgeon during final RT to offer support and to celebrate end of radiation treatment.  He was accompanied by his wife and dtr. I provided wife and dtr with a Certificate of Recognition for their supportive care. I provided verbal/written post-RT guidance:  Importance of keeping all follow-up appts, especially those with Nutrition and Dr. Isidore Moos.  Importance of protecting treatment area from sun.  Continuation of Sonafine application 2-3 times daily until supply exhausted after which transition to OTC lotion with vitamin E. I explained that my role as navigator will continue for several more months and that I will be calling and/or joining him during follow-up visits.   I encouraged him to call me with needs/concerns.   Patient and wife verbalized understanding of information provided.  Gayleen Orem, RN, BSN, [place] at [place] [phone]

## 2017-06-01 ENCOUNTER — Ambulatory Visit: Payer: PRIVATE HEALTH INSURANCE

## 2017-06-04 NOTE — Progress Notes (Signed)
  Radiation Oncology         [phone]) [phone] ________________________________  Name: Micheal Faulkner MRN: [id number]  Date: 05/31/2017  DOB: 04-16-1971  End of Treatment Note  Diagnosis:   47 y.o. 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  He reported decreased right eye vision at the start of treatment and was sent to ophthalmology for baseline studies. He experienced moderate fatigue and some skin irritation. He reported discomfort to the radiation site with erythema, hyperpigmentation, dryness, and itching to his face. He is using Sonafine twice daily as directed. He reported mild to moderate pain from mucositis, managed with lidocaine. He also reported dry mouth, salivary changes, dysgeusia, nasal discharge, and mouth ulcers. On examination he did have mild mucositis to his hard palate. He reported his oral intake slightly decreased due to the pain, but he did not use a PEG tube, and his weight remained stable.  Plan: The patient has completed radiation treatment. The patient will return to radiation oncology clinic for routine followup in two weeks. I advised them to call or return sooner if they have any questions or concerns related to their recovery or treatment.  -----------------------------------  Eppie Gibson, MD  This document serves as a record of services personally performed by Eppie Gibson, MD. It was created on her behalf by Rae Lips, a trained medical scribe. The creation of this record is based on the scribe's personal observations and the provider's statements to them. This document has been checked and approved by the attending provider.

## 2017-06-14 ENCOUNTER — Encounter: Payer: Self-pay | Admitting: Radiation Oncology

## 2017-06-14 NOTE — Progress Notes (Signed)
Micheal Faulkner presents for follow up of radiation completed 05/31/17 to his Nasal cavity.   Pain issues, if any: He reports pain to his nasal stents. They were looked at on Monday at [facility].  Using a feeding tube?: N/A Weight changes, if any:   05/17/17 163.6 lb 05/24/17 164.6 lb 05/31/17 164.6 lb 06/16/17 161.2 lb Swallowing issues, if any: He reports food gets stuck because of dried mucous. He also has difficulty swallowing due to his stents against the roof of his mouth. He tells me that they were shortened during his doctor's appointment at [facility].  Smoking or chewing tobacco? No Using fluoride trays daily? Yes, daily Last ENT visit was on: Micheal Faulkner for Dr. Vicie Mutters on 06/14/17. He was experiencing bleeding and went for a visit. He tells me that they cut a wire that was in his nose.  Other notable issues, if any:  Dr. Enrique Sack 06/30/17  BP (!) 127/91   Pulse (!) 55   Temp 98.3 F (36.8 C)   Ht 5\' 9"  (1.753 m)   Wt 161 lb 3.2 oz (73.1 kg)   SpO2 97% Comment: room air  BMI 23.81 kg/m

## 2017-06-16 ENCOUNTER — Encounter: Payer: Self-pay | Admitting: *Deleted

## 2017-06-16 ENCOUNTER — Encounter: Payer: Self-pay | Admitting: Radiation Oncology

## 2017-06-16 ENCOUNTER — Ambulatory Visit
Admission: RE | Admit: 2017-06-16 | Discharge: 2017-06-16 | Disposition: A | Discharge status: Home / Self Care | Payer: PRIVATE HEALTH INSURANCE | Source: Ambulatory Visit | Attending: Radiation Oncology | Admitting: Radiation Oncology

## 2017-06-16 VITALS — BP 127/91 | HR 55 | Temp 98.3°F | Ht 69.0 in | Wt 161.2 lb

## 2017-06-16 DIAGNOSIS — C300 Malignant neoplasm of nasal cavity: Secondary | ICD-10-CM | POA: Diagnosis not present

## 2017-06-16 DIAGNOSIS — Z9689 Presence of other specified functional implants: Secondary | ICD-10-CM | POA: Insufficient documentation

## 2017-06-16 DIAGNOSIS — R1319 Other dysphagia: Secondary | ICD-10-CM | POA: Diagnosis not present

## 2017-06-16 HISTORY — DX: Personal history of irradiation: Z92.3

## 2017-06-16 NOTE — Progress Notes (Signed)
Radiation Oncology         [phone]) [phone] ________________________________  Name: Micheal Faulkner MRN: [id number]  Date: 06/16/2017  DOB: Nov 03, 1970  Follow-Up Visit Note  CC: Patient, No Pcp Per  [name], MD  Diagnosis and Prior Radiotherapy:       ICD-10-CM   1. 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   Pain issues, if any: He reports pain to his nasal stents. They were looked at two days ago at [facility].   Using a feeding tube?: N/A  Weight changes, if any:  Wt Readings from Last 3 Encounters:  06/16/17 161 lb 3.2 oz (73.1 kg)  05/21/17 164 lb (74.4 kg)  05/18/17 163 lb 3.2 oz (74 kg)   Swallowing issues, if any: He reports food gets stuck because of dried mucous. He also has difficulty swallowing due to his stents against the roof of his mouth. He states that they were shortened during his appointment at [facility].   Smoking or chewing tobacco? No  Using fluoride trays daily? Yes, daily  Last ENT visit was on: 02/04/20199 with Shirlean Mylar, PA-C for Dr. Vicie Mutters. He was experiencing bleeding and went for a visit. He states that they cut a wire that was in his nose.   Other notable issues, if any: He is scheduled to see Dr. Enrique Sack on 06/30/2017.             ALLERGIES:  has No Known Allergies.  Meds: Current Outpatient Medications  Medication Sig Dispense Refill  . acetaminophen (TYLENOL) 500 MG tablet Take 1,000 mg by mouth.    . ALPRAZolam (XANAX) 0.5 MG tablet Take 0.5 mg by mouth.    Marland Kitchen aspirin 325 MG tablet Take 325 mg by mouth.    . escitalopram  (LEXAPRO) 10 MG tablet TAKE 1 TABLET BY MOUTH ONCE DAILY IN THE MORNING  1  . ibuprofen (ADVIL,MOTRIN) 200 MG tablet Take 200 mg by mouth.    . lidocaine (XYLOCAINE) 2 % solution Patient: Mix 1part 2% viscous lidocaine, 1part H20. Swish+spit 61mL of diluted mixture, 8xs a day, PRN soreness. May apply to nostrils PRN. 100 mL 5  . Multiple Vitamins-Minerals (MULTIVITAMIN WITH MINERALS) tablet Take 1 tablet by mouth daily.    . rosuvastatin (CRESTOR) 20 MG tablet TAKE 1 TABLET BY MOUTH EVERY EVENING AFTER A MEAL  1  . sodium fluoride (FLUORISHIELD) 1.1 % GEL dental gel Instill 1 drop of gel per tooth space of fluoride tray.  Place over teeth for 5 minutes.  Remove.  Spit out excess.  Repeat nightly. 120 mL prn  . tadalafil (CIALIS) 10 MG tablet Take 10 mg by mouth.    . Testosterone 25 MG/2.5GM (1%) GEL Apply 1 packet topically daily.    . traZODone (DESYREL) 50 MG tablet TAKE 1 TABLET BY MOUTH EVERY EVENING AT BEDTIME AS NEEDED FOR SLEEP  3  . V-R NASAL SPRAY SALINE 0.65 % nasal spray   2  . zolpidem (AMBIEN) 10 MG tablet TAKE 1/2 TABLET BY MOUTH AT BEDTIME AS NEEDED FOR SLEEP  2  . diclofenac (VOLTAREN) 75 MG EC tablet Take 1 tablet (75 mg total) by mouth 2 (two) times daily. (Patient not taking: Reported on 03/31/2017) 30 tablet 0  . HYDROcodone-acetaminophen (NORCO/VICODIN) 5-325 MG tablet Take 1 tablet by mouth every 6 (six) hours as needed for moderate pain. (Patient not taking: Reported on 06/16/2017) 30 tablet 0   No current facility-administered medications for this encounter.     Physical Findings: Wt Readings from Last 3 Encounters:  06/16/17 161 lb 3.2 oz (73.1 kg)  05/21/17 164 lb (74.4 kg)  05/18/17 163 lb 3.2 oz (74 kg)    height is 5\' 9"  (1.753 m) and weight is 161 lb 3.2 oz (73.1 kg). His temperature is 98.3 F (36.8 C). His blood pressure is 127/91 (abnormal) and his pulse is 55 (abnormal). His oxygen saturation is 97%.   General: Alert and oriented, in no acute  distress. HEENT: Head is normocephalic. Extraocular movements are intact. Hard palate. Oropharynx is clear. No residual mucositis in the upper inner lip or upper gums. When his stents are removed, he still has some significant moisture of the tissues internally, the nasal cavity, and at the nasal septum. Skin: Skin in treatment fields shows satisfactory healing and is still hyperpigmented over his face. Psychiatric: Judgment and insight are intact. Affect is appropriate.   Lab Findings: No results found for: WBC, HGB, HCT, MCV, PLT  No results found for: TSH  Radiographic Findings: No results found.  Impression/Plan:    1) Head and Neck Cancer Status: Healing from radiotherapy. He still has some hyperpigmentation over his face. I recommended that he apply vitamin E oil to the radiation site.  2) Nutritional Status: Weight has decreased 3 pounds in the past 2 weeks. He is able to eat most foods but has some taste changes.   3) Swallowing: He has a little difficulty swallowing due to stents against the roof of his mouth. They were shortened during his appointment at [facility].   4) Dental: Encouraged to continue regular followup with dentistry, and dental hygiene including fluoride rinses. He is using fluoride trays daily.  5)  Follow-up in 6 months. In the meantime, he will continue to follow with ENT at [facility]. He anticipates further reconstruction of his nose but does not know when. I will suggest that they re-image his chest with a CT scan before extensive surgery to continue surveillance of nonspecific lung nodules that were appreciated on CT scan performed at [facility] last September. He is scheduled to see Dr. Vicie Mutters next on February 18th. _____________________________________   Eppie Gibson, MD  This document serves as a record of services personally performed by Eppie Gibson, MD. It was created on her behalf by Rae Lips, a trained medical scribe. The creation of this record is based  on the scribe's personal observations and the provider's statements to them. This document has been checked and approved by the attending provider.

## 2017-06-16 NOTE — Progress Notes (Signed)
Oncology Nurse Navigator Documentation  Navigation Flowsheet update.  Gayleen Orem, RN, BSN Head & Neck Oncology Nurse [place] at [place] [phone]

## 2017-06-30 ENCOUNTER — Ambulatory Visit (HOSPITAL_COMMUNITY): Payer: Self-pay | Admitting: Dentistry

## 2017-06-30 ENCOUNTER — Encounter (HOSPITAL_COMMUNITY): Payer: Self-pay | Admitting: Dentistry

## 2017-06-30 VITALS — BP 124/74 | HR 62 | Temp 98.2°F | Wt 166.0 lb

## 2017-06-30 DIAGNOSIS — K117 Disturbances of salivary secretion: Secondary | ICD-10-CM | POA: Diagnosis not present

## 2017-06-30 DIAGNOSIS — J392 Other diseases of pharynx: Secondary | ICD-10-CM

## 2017-06-30 DIAGNOSIS — K1230 Oral mucositis (ulcerative), unspecified: Secondary | ICD-10-CM

## 2017-06-30 DIAGNOSIS — Z923 Personal history of irradiation: Secondary | ICD-10-CM

## 2017-06-30 DIAGNOSIS — K1233 Oral mucositis (ulcerative) due to radiation: Secondary | ICD-10-CM

## 2017-06-30 DIAGNOSIS — R432 Parageusia: Secondary | ICD-10-CM

## 2017-06-30 DIAGNOSIS — C300 Malignant neoplasm of nasal cavity: Secondary | ICD-10-CM

## 2017-06-30 DIAGNOSIS — R682 Dry mouth, unspecified: Secondary | ICD-10-CM

## 2017-06-30 MED ORDER — SODIUM FLUORIDE 1.1 % DT CREA: TOPICAL_CREAM | DENTAL | 99 refills | Status: AC

## 2017-06-30 NOTE — Patient Instructions (Signed)
RECOMMENDATIONS: 1. Brush after meals and at bedtime.  Use fluoride at bedtime. Will prescribe Prevident 5000 Plus to use as an alternative to FluoriSHIELD in fluoride trays. 2. Use trismus exercises as directed. 3. Use Biotene Rinse or salt water/baking soda rinses. 4. Multiple sips of water as needed. 5. Patient wishes to be referred to Dr. Dorann Lodge for follow up dental care. ROI was signed today.   Lenn Cal, DDS

## 2017-06-30 NOTE — Progress Notes (Signed)
06/30/2017  Patient Name:   Micheal Faulkner Date of Birth:   03/09/1971 Medical Record Number: [id number]  BP 124/74 (BP Location: Right Arm)   Pulse 62   Temp 98.2 F (36.8 C)   Wt 166 lb (75.3 kg)   BMI 24.51 kg/m   [name] presents for oral examination after radiation therapy. Patient has completed all radiation treatments from 04/15/2017 thru 05/31/2017. There was no chemotherapy.  REVIEW OF CHIEF COMPLAINTS:  DRY MOUTH:  Yes. HARD TO SWALLOW: No  HURT TO SWALLOW: No TASTE CHANGES: Taste has not returned. SORES IN MOUTH: Roof of mouth is sore. TRISMUS: No problems with trismus symptoms. WEIGHT: 166 pounds down from original 200 pounds.  HOME OH REGIMEN:  BRUSHING: twice a day FLOSSING: once a week RINSING: using Biotene rinses FLUORIDE: using fluoride at bedtime TRISMUS EXERCISES:  Maximum interincisal opening: 40 mm   DENTAL EXAM:  Oral Hygiene:(PLAQUE): Good oral hygiene LOCATION OF MUCOSITIS:   Hard palate with erythema. DESCRIPTION OF SALIVA: Decreased and foamy saliva. ANY EXPOSED BONE: None noted OTHER WATCHED AREAS: Maxillary dentition in primary field of radiation therapy DX: Xerostomia, Dysgeusia and Mucositis  RECOMMENDATIONS: 1. Brush after meals and at bedtime.  Use fluoride at bedtime. Will prescribe Prevident 5000 Plus to use as an alternative to FluoriSHIELD in fluoride trays. 2. Use trismus exercises as directed. 3. Use Biotene Rinse or salt water/baking soda rinses. 4. Multiple sips of water as needed. 5. Patient wishes to be referred to Dr. Dorann Lodge for follow up dental care. ROI was signed today.   Lenn Cal, DDS

## 2017-07-30 MED FILL — FLUORISHIELD 1.1% GEL: 1.1 % | 60 days supply | Qty: 228 | Fill #1

## 2017-10-05 ENCOUNTER — Encounter: Payer: Self-pay | Admitting: *Deleted

## 2017-10-05 NOTE — Progress Notes (Signed)
On 10-05-17 fax medical records to [place] [facility] , it was the last office note which was 06-16-17

## 2017-12-08 NOTE — Progress Notes (Signed)
Error, 

## 2017-12-20 ENCOUNTER — Inpatient Hospital Stay
Admission: RE | Admit: 2017-12-20 | Discharge: 2017-12-20 | Disposition: A | Discharge status: Home / Self Care | Payer: Self-pay | Source: Ambulatory Visit | Attending: Radiation Oncology | Admitting: Radiation Oncology

## 2018-01-04 ENCOUNTER — Encounter: Payer: Self-pay | Admitting: *Deleted

## 2018-01-04 ENCOUNTER — Telehealth: Payer: Self-pay | Admitting: *Deleted

## 2018-01-04 NOTE — Progress Notes (Signed)
On 01-04-18 fax the most updated office note to [place] [facility]

## 2018-01-04 NOTE — Telephone Encounter (Signed)
called patient to ask about missing fu  On 12-20-17, patient stated that he has just seen Dr. Vicie Mutters and had imaging, he is scheduled for more imaing by Dr. Vicie Mutters on 03-05-18 and will see Dr. Vicie Mutters afterwards, notified Dr. Isidore Moos

## 2018-01-04 NOTE — Telephone Encounter (Signed)
Called patient to inform that Dr. Isidore Moos will leave rescheduling fu appt. with her in Dr. Waynard Edwards hands, lvm for a return call

## 2018-02-12 DIAGNOSIS — Z23 Encounter for immunization: Secondary | ICD-10-CM | POA: Diagnosis not present

## 2018-03-07 DIAGNOSIS — C300 Malignant neoplasm of nasal cavity: Secondary | ICD-10-CM | POA: Diagnosis not present

## 2018-03-07 DIAGNOSIS — Z8522 Personal history of malignant neoplasm of nasal cavities, middle ear, and accessory sinuses: Secondary | ICD-10-CM | POA: Diagnosis not present

## 2018-03-07 DIAGNOSIS — R918 Other nonspecific abnormal finding of lung field: Secondary | ICD-10-CM | POA: Diagnosis not present

## 2018-03-07 DIAGNOSIS — Z08 Encounter for follow-up examination after completed treatment for malignant neoplasm: Secondary | ICD-10-CM | POA: Diagnosis not present

## 2018-03-24 DIAGNOSIS — Q219 Congenital malformation of cardiac septum, unspecified: Secondary | ICD-10-CM | POA: Diagnosis not present

## 2018-03-24 DIAGNOSIS — M950 Acquired deformity of nose: Secondary | ICD-10-CM | POA: Diagnosis not present

## 2018-05-20 DIAGNOSIS — G35 Multiple sclerosis: Secondary | ICD-10-CM | POA: Diagnosis not present

## 2018-05-20 DIAGNOSIS — E7849 Other hyperlipidemia: Secondary | ICD-10-CM | POA: Diagnosis not present

## 2018-05-20 DIAGNOSIS — E291 Testicular hypofunction: Secondary | ICD-10-CM | POA: Diagnosis not present

## 2018-05-20 DIAGNOSIS — N528 Other male erectile dysfunction: Secondary | ICD-10-CM | POA: Diagnosis not present

## 2018-06-06 DIAGNOSIS — E785 Hyperlipidemia, unspecified: Secondary | ICD-10-CM | POA: Diagnosis not present

## 2018-06-06 DIAGNOSIS — E291 Testicular hypofunction: Secondary | ICD-10-CM | POA: Diagnosis not present

## 2018-06-06 DIAGNOSIS — E7849 Other hyperlipidemia: Secondary | ICD-10-CM | POA: Diagnosis not present

## 2018-06-13 DIAGNOSIS — R918 Other nonspecific abnormal finding of lung field: Secondary | ICD-10-CM | POA: Diagnosis not present

## 2018-06-13 DIAGNOSIS — R911 Solitary pulmonary nodule: Secondary | ICD-10-CM | POA: Diagnosis not present

## 2018-06-13 DIAGNOSIS — Z8522 Personal history of malignant neoplasm of nasal cavities, middle ear, and accessory sinuses: Secondary | ICD-10-CM | POA: Diagnosis not present

## 2018-06-13 DIAGNOSIS — C300 Malignant neoplasm of nasal cavity: Secondary | ICD-10-CM | POA: Diagnosis not present

## 2018-06-17 DIAGNOSIS — R911 Solitary pulmonary nodule: Secondary | ICD-10-CM | POA: Diagnosis not present

## 2018-06-17 DIAGNOSIS — Z8522 Personal history of malignant neoplasm of nasal cavities, middle ear, and accessory sinuses: Secondary | ICD-10-CM | POA: Diagnosis not present

## 2018-06-17 DIAGNOSIS — F17210 Nicotine dependence, cigarettes, uncomplicated: Secondary | ICD-10-CM | POA: Diagnosis not present

## 2018-07-05 DIAGNOSIS — R911 Solitary pulmonary nodule: Secondary | ICD-10-CM | POA: Diagnosis not present

## 2018-07-05 DIAGNOSIS — Z01818 Encounter for other preprocedural examination: Secondary | ICD-10-CM | POA: Diagnosis not present

## 2018-07-12 DIAGNOSIS — C3411 Malignant neoplasm of upper lobe, right bronchus or lung: Secondary | ICD-10-CM | POA: Diagnosis not present

## 2018-07-12 DIAGNOSIS — Z885 Allergy status to narcotic agent status: Secondary | ICD-10-CM | POA: Diagnosis not present

## 2018-07-12 DIAGNOSIS — E785 Hyperlipidemia, unspecified: Secondary | ICD-10-CM | POA: Diagnosis not present

## 2018-07-12 DIAGNOSIS — G8918 Other acute postprocedural pain: Secondary | ICD-10-CM | POA: Diagnosis not present

## 2018-07-12 DIAGNOSIS — C7801 Secondary malignant neoplasm of right lung: Secondary | ICD-10-CM | POA: Diagnosis not present

## 2018-07-12 DIAGNOSIS — Z7982 Long term (current) use of aspirin: Secondary | ICD-10-CM | POA: Diagnosis not present

## 2018-07-12 DIAGNOSIS — R911 Solitary pulmonary nodule: Secondary | ICD-10-CM | POA: Diagnosis not present

## 2018-07-12 DIAGNOSIS — Z85828 Personal history of other malignant neoplasm of skin: Secondary | ICD-10-CM | POA: Diagnosis not present

## 2018-07-12 DIAGNOSIS — Z87891 Personal history of nicotine dependence: Secondary | ICD-10-CM | POA: Diagnosis not present

## 2018-07-12 DIAGNOSIS — Z79899 Other long term (current) drug therapy: Secondary | ICD-10-CM | POA: Diagnosis not present

## 2018-07-12 DIAGNOSIS — G43909 Migraine, unspecified, not intractable, without status migrainosus: Secondary | ICD-10-CM | POA: Diagnosis not present

## 2018-07-12 DIAGNOSIS — G35 Multiple sclerosis: Secondary | ICD-10-CM | POA: Diagnosis not present

## 2018-07-13 DIAGNOSIS — R911 Solitary pulmonary nodule: Secondary | ICD-10-CM | POA: Diagnosis not present

## 2018-07-14 DIAGNOSIS — R911 Solitary pulmonary nodule: Secondary | ICD-10-CM | POA: Diagnosis not present

## 2018-08-08 DIAGNOSIS — C3411 Malignant neoplasm of upper lobe, right bronchus or lung: Secondary | ICD-10-CM | POA: Diagnosis not present

## 2018-10-28 DIAGNOSIS — E7849 Other hyperlipidemia: Secondary | ICD-10-CM | POA: Diagnosis not present

## 2018-10-28 DIAGNOSIS — Z0000 Encounter for general adult medical examination without abnormal findings: Secondary | ICD-10-CM | POA: Diagnosis not present

## 2018-10-28 DIAGNOSIS — E291 Testicular hypofunction: Secondary | ICD-10-CM | POA: Diagnosis not present

## 2018-10-28 DIAGNOSIS — Z125 Encounter for screening for malignant neoplasm of prostate: Secondary | ICD-10-CM | POA: Diagnosis not present

## 2018-11-04 DIAGNOSIS — C3490 Malignant neoplasm of unspecified part of unspecified bronchus or lung: Secondary | ICD-10-CM | POA: Diagnosis not present

## 2018-11-04 DIAGNOSIS — G4700 Insomnia, unspecified: Secondary | ICD-10-CM | POA: Diagnosis not present

## 2018-11-04 DIAGNOSIS — Z1331 Encounter for screening for depression: Secondary | ICD-10-CM | POA: Diagnosis not present

## 2018-11-04 DIAGNOSIS — C760 Malignant neoplasm of head, face and neck: Secondary | ICD-10-CM | POA: Diagnosis not present

## 2018-11-04 DIAGNOSIS — Z0000 Encounter for general adult medical examination without abnormal findings: Secondary | ICD-10-CM | POA: Diagnosis not present

## 2018-11-04 DIAGNOSIS — F419 Anxiety disorder, unspecified: Secondary | ICD-10-CM | POA: Diagnosis not present

## 2018-11-09 DIAGNOSIS — C300 Malignant neoplasm of nasal cavity: Secondary | ICD-10-CM | POA: Diagnosis not present

## 2018-11-18 DIAGNOSIS — C300 Malignant neoplasm of nasal cavity: Secondary | ICD-10-CM | POA: Diagnosis not present

## 2018-11-18 DIAGNOSIS — C3490 Malignant neoplasm of unspecified part of unspecified bronchus or lung: Secondary | ICD-10-CM | POA: Diagnosis not present

## 2018-11-23 DIAGNOSIS — C300 Malignant neoplasm of nasal cavity: Secondary | ICD-10-CM | POA: Diagnosis not present

## 2019-01-09 IMAGING — CT CT MAXILLOFACIAL W/ CM
1 series · 15 of 30 positions shown, 19 images · IV contrast (APPLIED)
Comparison: None.

CLINICAL DATA: Congestion and bloody drainage. Swelling and
pressure.

EXAM:
CT MAXILLOFACIAL WITH CONTRAST
TECHNIQUE: Multidetector CT imaging of the maxillofacial structures was
performed. Multiplanar CT image reconstructions were also generated.
A small metallic BB was placed on the right temple in order to
reliably differentiate right from left.
CONTRAST:  75mL 2AZYKG-133 IOPAMIDOL (2AZYKG-133) INJECTION 61%

[Series 4: maxofacial -soft · axial · 0.38mm/px · z∈[-142,-20]mm · 15 of 45 slices shown, 19 images]
[im 2/45  brain]
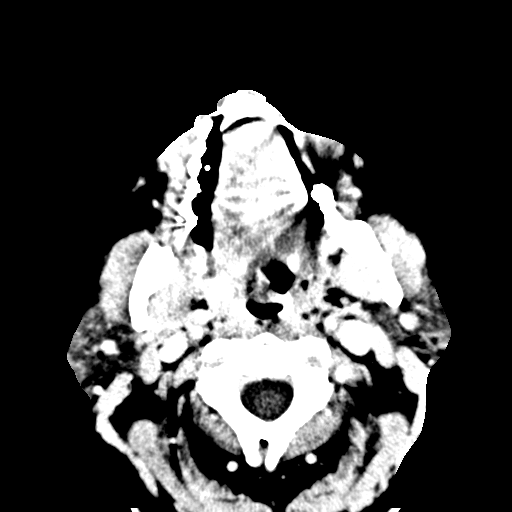
[im 2/45  bone]
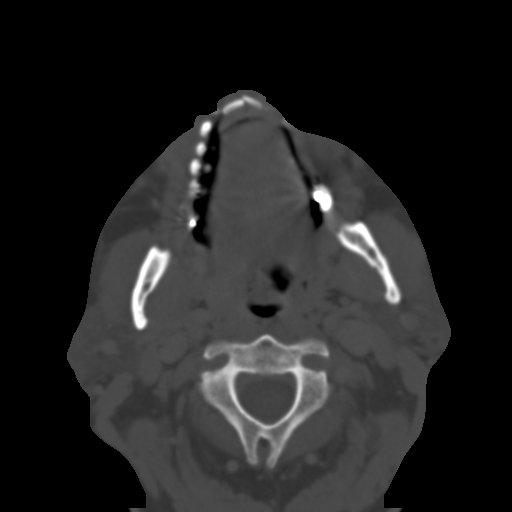
[im 5/45  bone]
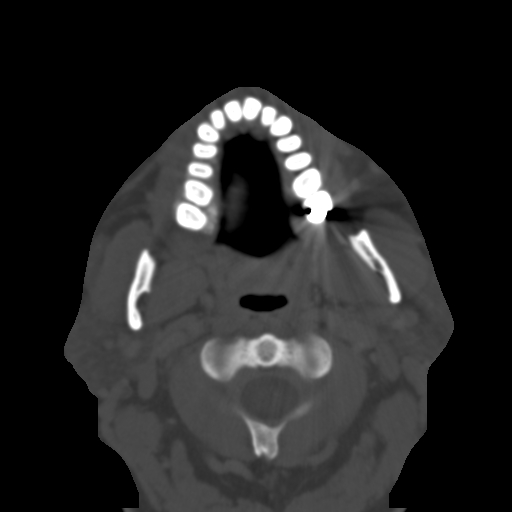
[im 8/45  bone]
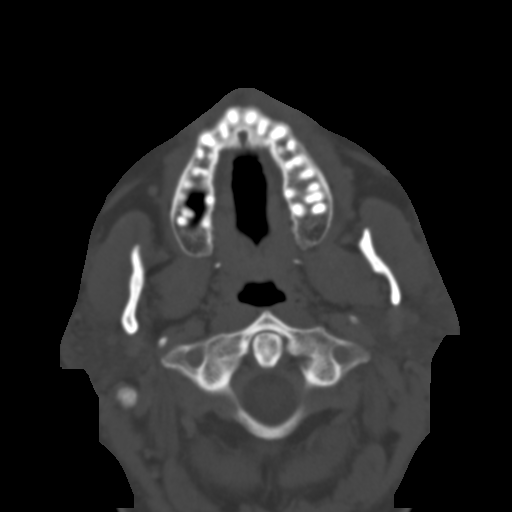
[im 11/45  bone]
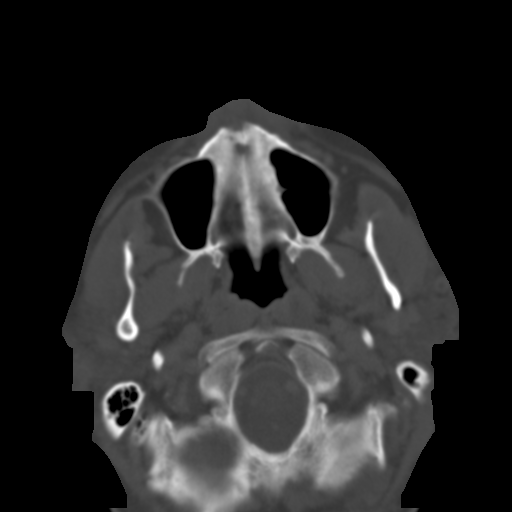
[im 14/45  brain]
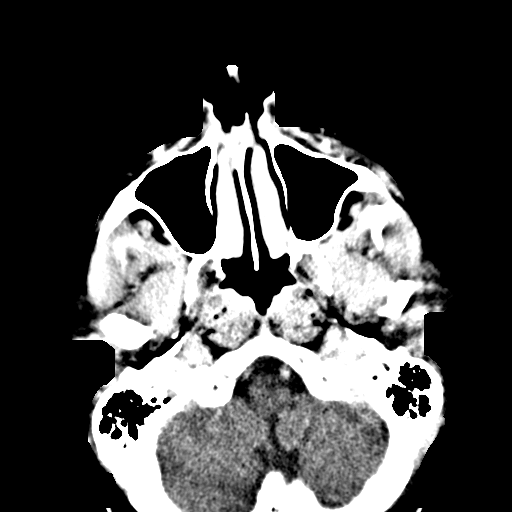
[im 14/45  bone]
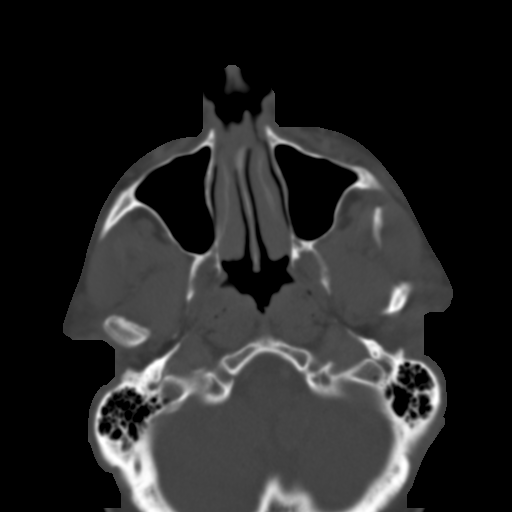
[im 17/45  bone]
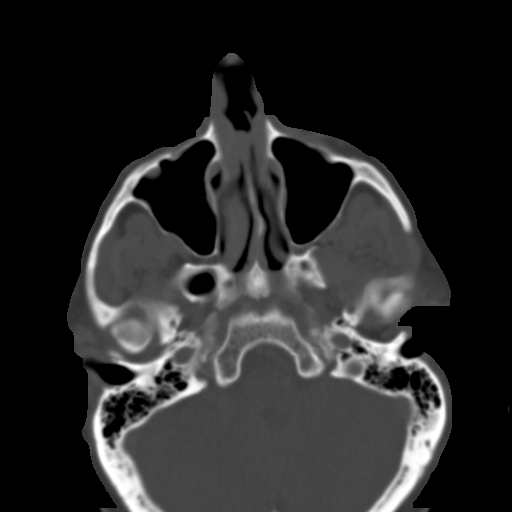
[im 20/45  bone]
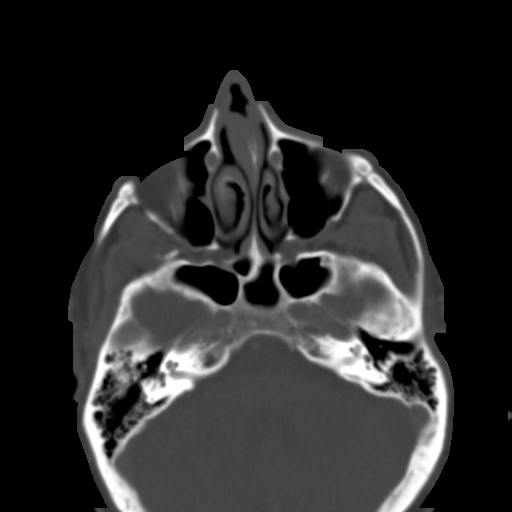
[im 23/45  bone]
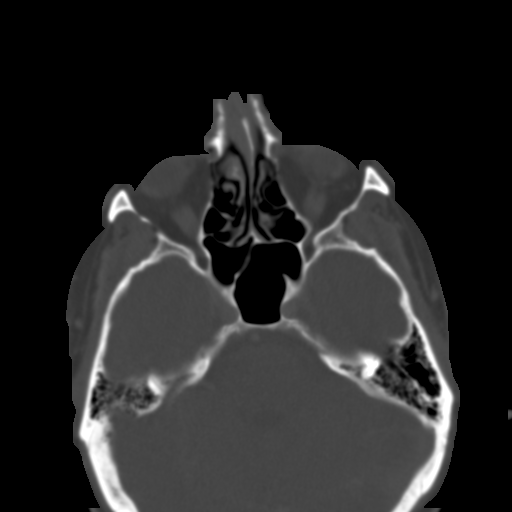
[im 25/45  brain]
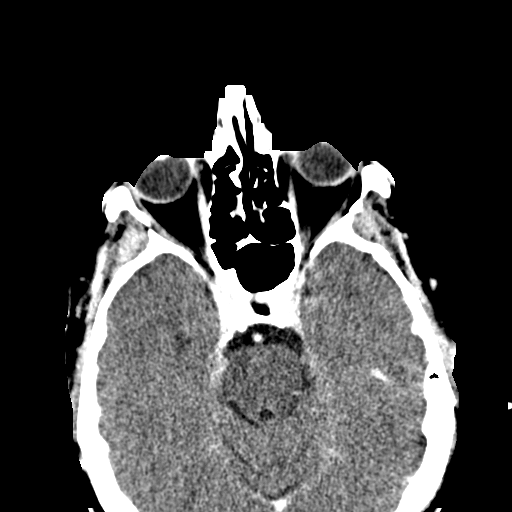
[im 25/45  bone]
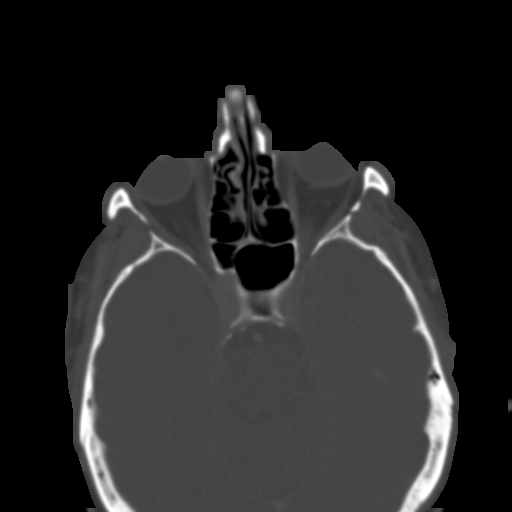
[im 28/45  bone]
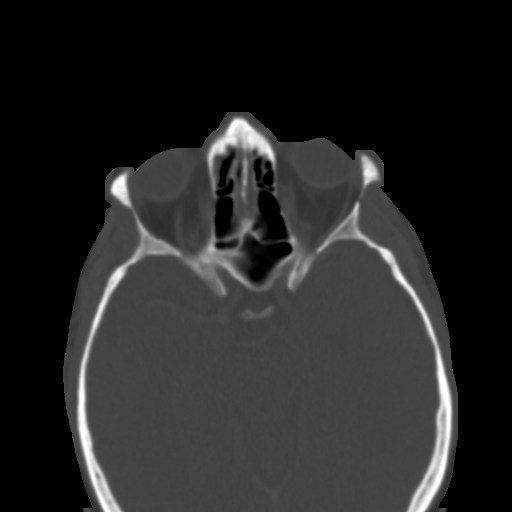
[im 31/45  bone]
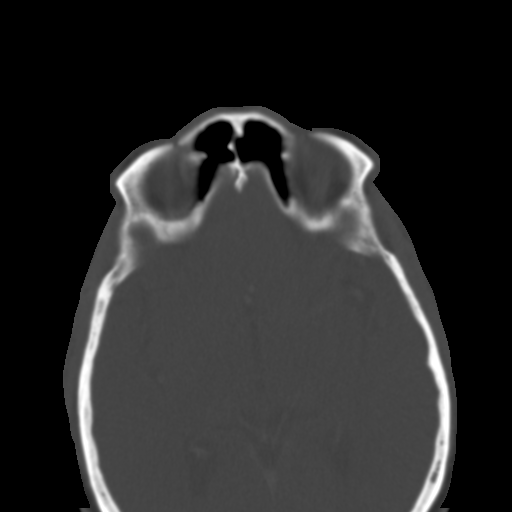
[im 34/45  bone]
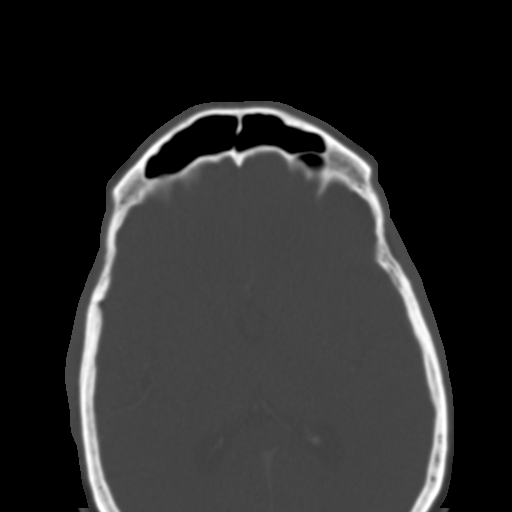
[im 37/45  brain]
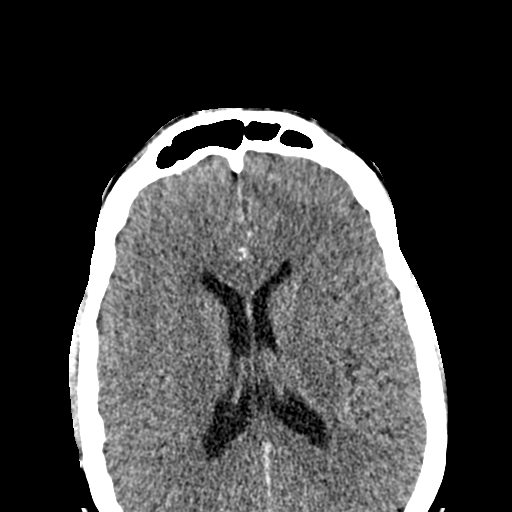
[im 37/45  bone]
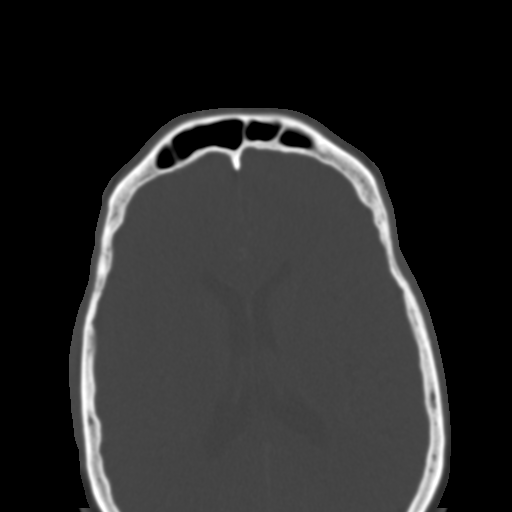
[im 40/45  bone]
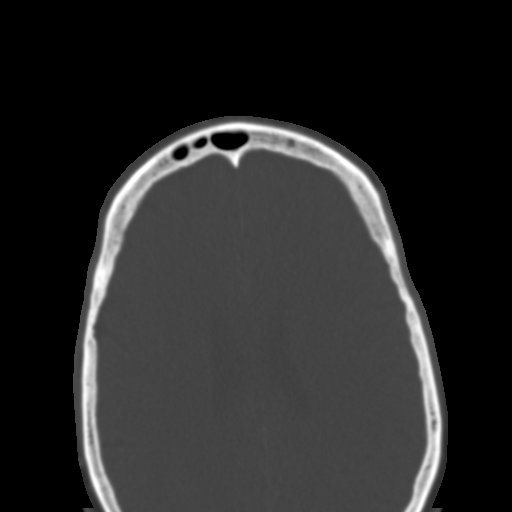
[im 43/45  bone]
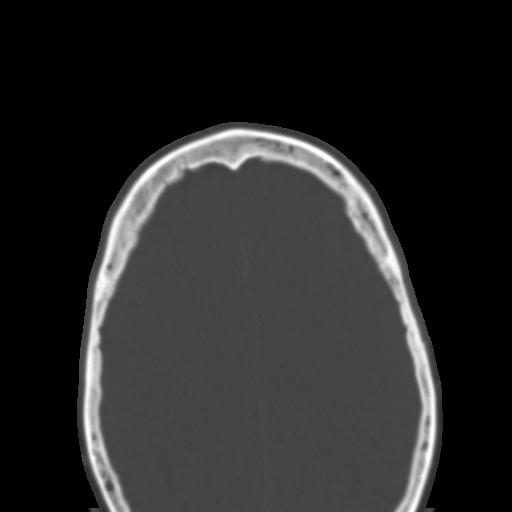

[15 of 30 positions shown; findings below may reference images not displayed]

FINDINGS: Osseous: No bone abnormality. No evidence of old or recent injury.
No nasal fracture. Some degenerative changes of the
temporomandibular joint on the left with mild subluxation.

Orbits: Normal

Sinuses: Frontal, ethmoid, sphenoid and maxillary sinuses are clear
without mucosal thickening, polyp, cyst, mass or free fluid.
Ostiomeatal complexes appear patent and normal bilaterally. Nasal
septum bows towards the left. Nasal passages do appear sufficiently
patent deficient nasal septum anteriorly.

Soft tissues: Otherwise negative

Limited intracranial: Negative
IMPRESSION: No evidence of inflammatory sinus disease.

Deficiency of the nasal septum anteriorly.

## 2020-04-17 ENCOUNTER — Telehealth: Payer: Self-pay

## 2020-04-17 NOTE — Telephone Encounter (Signed)
Spoke with patient and scheduled an in-person Palliative Consult for 05/22/2020 @ 12:30PM. Wife requested this appointment date and time.    COVID screening was negative. Patient has a dog in the home, will put up before NP arrives. Patient lives with wife and child.    Consent obtained; updated Outlook/Netsmart/Team List and Epic.

## 2020-05-22 ENCOUNTER — Other Ambulatory Visit: Payer: Medicaid Other | Admitting: Hospice

## 2020-05-22 ENCOUNTER — Other Ambulatory Visit: Payer: Self-pay

## 2020-05-22 DIAGNOSIS — Z515 Encounter for palliative care: Secondary | ICD-10-CM

## 2020-05-22 DIAGNOSIS — C300 Malignant neoplasm of nasal cavity: Secondary | ICD-10-CM

## 2020-05-22 NOTE — Progress Notes (Addendum)
PATIENT NAME: Micheal Faulkner [address] [place] [address] [phone] (home)  DOB: Apr 28, 1971 MRN: [id number]  PRIMARY CARE PROVIDER:    Dr Alysia Penna: [facility] REFERRING PROVIDER:   Elfredia Nevins, MD [facility] [place],  [place] [address] [phone]  ENT  RESPONSIBLE PARTY:   Extended Emergency Contact Information Primary Emergency Contact: [place], [place] Home Phone: [phone] Relation: Spouse  Patient's cell: [phone]  I met face to face with patient and family in home/facility.  ADVANCE CARE PLANNING/RECOMMENDATIONS/PLAN:    Visit at the request of Dr. Elfredia Nevins   for palliative consult. Visit consisted of building trust and discussions on Palliative Medicine as specialized medical care for people living with serious illness, aimed at facilitating better quality of life through symptoms relief, assisting with advance care plan and establishing complex decision making.  Marylene Land is present with patient during visit.  Discussion on the difference between Palliative and Hospice care. Palliative care and hospice have similar goals of managing symptoms, promoting comfort, improving quality of life, and maintaining a person's dignity. However, palliative care may be offered during any phase of a serious illness, while hospice care is usually offered when a person is expected to live for 6 months or less. Spouse requested social work service to help family navigate through [employer] and other resource assistance.  Referral to [facility] social worker today.  Visit consisted of counseling and education dealing with the complex and emotionally intense issues of symptom management and palliative care in the setting of serious and potentially life-threatening illness. Patient said he is at peace with God and is ready when his God calls him home. Ample emotional support provided. Palliative care team will continue  to support patient, patient's family, and medical team.  Advance Care Planning: Our advance care planning conversation included a discussion about:    The value and importance of advance care planning  Exploration of goals of care in the event of a sudden injury or illness  Identification and preparation of a healthcare agent          Review and updating or creation of an advance directive document         CODE STATUS: Ramifications and implications of CODE STATUS discussed.  Patient affirmed he is a DO NOT RESUSCITATE.  NP signed DNR form for patient; same document uploaded to epic.discussion on patient's ability to change his decision anytime  he so wishes.   GOALS OF CARE: Goals of care include to maximize quality of life and symptom management. Patient wishes to get off immunotherapy in the future because of the side effects of fatigue -quality of life.   Follow up Palliative Care Visit: Palliative care will continue to follow for goals of care clarification and symptom management.  Follow-up in 6 weeks/as needed.  Patient given MOST form, preparatory to next visit.  Symptom Management:  Pain: Facial pain currently well managed with Fentanyl patch, Gabapentin, Oxycodone prn.  No report of adverse reaction, no constipation. Depression: continune on Wellbutrin as ordered. Patient has plans to see a psychiatrist for further evaluation.  Fatigue: Patient reports fatigue 2 to 3 days after that he is immunotherapy.  Encouraged balanced of rest and performance activity. Shortness of breath: patient with wheezing and shortness of breath during visit, related to asthma; he took his albuterol, effective.  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  Palliative will continue to monitor for symptom management/decline and make recommendations as needed.   Family /Caregiver/Community Supports: Spouse lives at  home with spouse and children.  Strong family support system identified.  I spent one hour and 46  minutes providing this initial consultation; time includes time spent with patient/family, chart review, provider coordination,  and documentation. More than 50% of the time in this consultation was spent on counseling patient and coordinating communication.   CHIEF COMPLAIN/HISTORY OF PRESENT ILLNESS:  Micheal Faulkner is a 50 y.o. 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 History obtained from review of EMR, discussion with patient.   Palliative Care was asked to follow this patient by consultation request of Elfredia Nevins, MD to help address advance care planning and complex decision making. Thank you for the opportunity to participate in the care of Micheal Faulkner    CODE STATUS: DNR  PPS: 60%  HOSPICE ELIGIBILITY/DIAGNOSIS: TBD  PAST MEDICAL HISTORY:  Past Medical History:  Diagnosis Date  . Asthma   . Epistaxis, recurrent 12/2016  . H/O seasonal allergies   . History of radiation therapy 04/15/17- 05/31/17   Nasal cavity-tumor bed/ 60 Gy in 30 fractions.   . Hyperlipidemia   . Migraine   . Nasal mass 12/2016    SOCIAL HX:  Social History   Tobacco Use  . Smoking status: Former Smoker    Packs/day: 0.50    Years: 30.00    Pack years: 15.00    Quit date: 01/03/2017    Years since quitting: 3.3  . Smokeless tobacco: Never Used  Substance Use Topics  . Alcohol use: No    Comment: he has not had alcohol since cancer diagnosis   FAMILY HX: No family history on file.   Labs:   No results for input(s): WBC, HGB, HCT, PLT, MCV in the last 168 hours. No results for input(s): NA, K, CL, CO2, BUN, CREATININE, GLUCOSE in the last 168 hours.  ALLERGIES: No Known Allergies   PERTINENT MEDICATIONS:   Outpatient Encounter Medications as of 05/22/2020  Medication Sig  . acetaminophen (TYLENOL) 500 MG tablet Take 1,000 mg by mouth.  . ALPRAZolam (XANAX) 0.5 MG tablet Take 0.5 mg by mouth.  Marland Kitchen aspirin 325 MG tablet Take 325 mg by mouth.  . diclofenac (VOLTAREN) 75 MG EC tablet Take 1 tablet (75 mg total) by mouth 2 (two) times daily. (Patient not taking: Reported on 03/31/2017)  . escitalopram (LEXAPRO) 10 MG tablet TAKE 1 TABLET BY MOUTH ONCE DAILY IN THE MORNING  . HYDROcodone-acetaminophen (NORCO/VICODIN) 5-325 MG tablet Take 1 tablet by mouth every 6 (six) hours as needed for moderate pain. (Patient not taking: Reported on 06/16/2017)  . ibuprofen (ADVIL,MOTRIN) 200 MG tablet Take 200 mg by mouth.  . lidocaine (XYLOCAINE) 2 % solution Patient: Mix 1part 2% viscous lidocaine, 1part H20. Swish+spit 10mL of diluted mixture, 8xs a day, PRN soreness. May apply to nostrils PRN.  . Multiple Vitamins-Minerals (MULTIVITAMIN WITH MINERALS) tablet Take 1 tablet by mouth daily.  . rosuvastatin (CRESTOR) 20 MG tablet TAKE 1 TABLET BY MOUTH EVERY EVENING AFTER A MEAL  . sodium fluoride (FLUORISHIELD) 1.1 % GEL dental gel Instill 1 drop of gel per tooth space of fluoride tray.  Place over teeth for 5 minutes.  Remove.  Spit out excess.  Repeat nightly.  . sodium fluoride (PREVIDENT 5000 PLUS) 1.1 % CREA dental cream Apply to tooth brush. Brush teeth for 2 minutes. Spit out excess. Repeat nightly.  . tadalafil (CIALIS) 10 MG tablet Take 10 mg by mouth.  . Testosterone 25 MG/2.5GM (1%) GEL Apply 1 packet topically daily.  . traZODone (DESYREL) 50 MG tablet TAKE 1 TABLET BY MOUTH EVERY EVENING AT BEDTIME AS NEEDED FOR SLEEP  . V-R NASAL SPRAY SALINE 0.65 % nasal spray   . zolpidem (AMBIEN) 10 MG tablet TAKE 1/2 TABLET BY MOUTH AT BEDTIME AS NEEDED FOR SLEEP   No facility-administered encounter medications on file as of 05/22/2020.    PHYSICAL EXAM/ROS:  General: NAD, cooperative Cardiovascular: regular rate  and rhythm; denies chest pain Pulmonary: Wheezing, shortness of breath resolved after albuterol treatment; oxygen saturation 98% on room air Abdomen: soft, nontender, + bowel sounds; no constipation Extremities: no edema, no joint deformities Neurological: Weakness but otherwise nonfocal  Note: Portions of this note were generated with Scientist, clinical (histocompatibility and immunogenetics). Dictation errors may occur despite best attempts at proofreading.  Rosaura Carpenter, NP

## 2020-05-23 ENCOUNTER — Telehealth: Payer: Self-pay

## 2020-05-23 NOTE — Telephone Encounter (Signed)
(  1:41p) Palliative care SW attempted to follow-up with patient/PCG per request of NP-L. [place]sh[place]. SW did not receive an answer, and left a message requesting a call back.

## 2020-07-03 ENCOUNTER — Telehealth: Payer: Self-pay

## 2020-07-03 ENCOUNTER — Other Ambulatory Visit: Payer: Medicaid Other | Admitting: Hospice

## 2020-07-03 ENCOUNTER — Other Ambulatory Visit: Payer: Self-pay

## 2020-07-03 DIAGNOSIS — C300 Malignant neoplasm of nasal cavity: Secondary | ICD-10-CM

## 2020-07-03 DIAGNOSIS — Z515 Encounter for palliative care: Secondary | ICD-10-CM

## 2020-07-03 DIAGNOSIS — F339 Major depressive disorder, recurrent, unspecified: Secondary | ICD-10-CM

## 2020-07-03 NOTE — Progress Notes (Signed)
[place] Consult Note Telephone: [phone]  Fax: [phone]  PATIENT NAME: Micheal Faulkner DOB: 1971-01-25 MRN: [id number]  PRIMARY CARE PROVIDER:    Dr Velna Hatchet: [facility] REFERRING PROVIDER:   [name], [facility] [place],  [place] [address] [phone]  ENT  RESPONSIBLE PARTY:   Extended Emergency Contact Information Primary Emergency Contact: Sanjuan, [place] Phone: [phone] Relation: Spouse  Patient's cell: [phone]     Visit is to build trust and highlight Palliative Medicine as specialized medical care for people living with serious illness, aimed at facilitating better quality of life through symptoms relief, assisting with advance care plan and establishing goals of care.   CHIEF COMPLAINT: Follow up palliative visit/Depression  RECOMMENDATIONS/PLAN:   1. Advance Care Planning/Code Status: Patient remains a DNR  2. Goals of Care: Goals of care include to maximize quality of life and symptom management. Extensive MOST discussions today. MOST selections include additional limited interventions, IV fluids as indicated, antibiotics as indicated, feeding tube for a defined trial period. Signed MOST form at home with patient and same document uploaded to Epic todday. Visit consisted of counseling and education dealing with the complex and emotionally intense issues of symptom management and palliative care in the setting of serious and potentially life-threatening illness. Palliative care team will continue to support patient, patient's family, and medical team.  I spent 46 minutes providing this consultation. More than 50% of the time in this consultation was spent on coordinating communication.  -------------------------------------------------------------------------------------------------------------------------------------------------- 3. Symptom  management/Plan:  Depression: Continue Wellbutrin as currently ordered. Seen in [facility] last week for depression with no new medication added. Appointment to see Psychiatrist 07/08/20 and CA support therapist 07/09/20. Continue reading therapy; patient loves to read science fiction which brings him some joy and perspective. Continue to play the guitar and engage in activities that promote good mood and wellbeing.  Continue immunotherapy for nasal cancer and follow up with Oncologist as planned.   Palliative will continue to monitor for symptom management/decline and make recommendations as needed. Return 2 months or prn. Encouraged to call provider sooner with any concerns.   HISTORY OF PRESENT ILLNESS:  Micheal Faulkner is a 50 y.o. 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 Depression is intermittent, comes and goes, worsened when patient feels powerless in his health conditions; helped with medication and support group activities. 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 History obtained from review of EMR, discussion with patient/family. Review and summarization of Epic records shows history from other than patient. Rest of 10 point ROS asked and negative. Palliative Care was asked to follow this patient by consultation request of No ref. provider found to help address complex decision making in the context of goals of care.   CODE STATUS: DNR  PPS: 60%  HOSPICE ELIGIBILITY/DIAGNOSIS: TBD  PAST MEDICAL HISTORY:  Past Medical History:  Diagnosis Date  . Asthma   . Epistaxis, recurrent 12/2016  . H/O seasonal allergies   . History of radiation therapy 04/15/17- 05/31/17   Nasal cavity-tumor bed/ 60 Gy in 30 fractions.   .  Hyperlipidemia   . Migraine   . Nasal mass 12/2016    SOCIAL HX: @SOCX  Patient lives  at home with his spouse for ongoing care   FAMILY HX:  Mother: Breast CA Sister: Type 2 DM, Obesity  Review lab tests/diagnostics No results for input(s): WBC, HGB, HCT, PLT, MCV in the last 168 hours. No results for input(s): NA, K, CL, CO2, BUN, CREATININE, GLUCOSE in the last 168 hours. Latest GFR by Cockcroft Gault (not valid in AKI or ESRD) CrCl cannot be calculated (No successful lab value found.). No results for input(s): AST, ALT, ALKPHOS, GGT in the last 168 hours.  Invalid input(s): TBILI, CONJBILI, ALB, TOTALPROTEIN No components found for: ALB No results for input(s): APTT, INR in the last 168 hours.  Invalid input(s): PTPATIENT No results for input(s): BNP, PROBNP in the last 168 hours.  ALLERGIES: No Known Allergies    PERTINENT MEDICATIONS:  Outpatient Encounter Medications as of 07/03/2020  Medication Sig  . acetaminophen (TYLENOL) 500 MG tablet Take 1,000 mg by mouth.  . ALPRAZolam (XANAX) 0.5 MG tablet Take 0.5 mg by mouth.  Marland Kitchen aspirin 325 MG tablet Take 325 mg by mouth.  . diclofenac (VOLTAREN) 75 MG EC tablet Take 1 tablet (75 mg total) by mouth 2 (two) times daily. (Patient not taking: Reported on 03/31/2017)  . escitalopram (LEXAPRO) 10 MG tablet TAKE 1 TABLET BY MOUTH ONCE DAILY IN THE MORNING  . HYDROcodone-acetaminophen (NORCO/VICODIN) 5-325 MG tablet Take 1 tablet by mouth every 6 (six) hours as needed for moderate pain. (Patient not taking: Reported on 06/16/2017)  . ibuprofen (ADVIL,MOTRIN) 200 MG tablet Take 200 mg by mouth.  . lidocaine (XYLOCAINE) 2 % solution Patient: Mix 1part 2% viscous lidocaine, 1part H20. Swish+spit 32mL of diluted mixture, 8xs a day, PRN soreness. May apply to nostrils PRN.  . Multiple Vitamins-Minerals (MULTIVITAMIN WITH MINERALS) tablet Take 1 tablet by mouth daily.  . rosuvastatin (CRESTOR) 20 MG tablet TAKE 1 TABLET BY MOUTH EVERY EVENING  AFTER A MEAL  . sodium fluoride (FLUORISHIELD) 1.1 % GEL dental gel Instill 1 drop of gel per tooth space of fluoride tray.  Place over teeth for 5 minutes.  Remove.  Spit out excess.  Repeat nightly.  . sodium fluoride (PREVIDENT 5000 PLUS) 1.1 % CREA dental cream Apply to tooth brush. Brush teeth for 2 minutes. Spit out excess. Repeat nightly.  . tadalafil (CIALIS) 10 MG tablet Take 10 mg by mouth.  . Testosterone 25 MG/2.5GM (1%) GEL Apply 1 packet topically daily.  . traZODone (DESYREL) 50 MG tablet TAKE 1 TABLET BY MOUTH EVERY EVENING AT BEDTIME AS NEEDED FOR SLEEP  . V-R NASAL SPRAY SALINE 0.65 % nasal spray   . zolpidem (AMBIEN) 10 MG tablet TAKE 1/2 TABLET BY MOUTH AT BEDTIME AS NEEDED FOR SLEEP   No facility-administered encounter medications on file as of 07/03/2020.    ROS  General: NAD EYES: denies vision changes ENMT: denies dysphagia no xerostomia Cardiovascular: denies chest pain Pulmonary: denies SOB  Abdomen: endorses fair appetite, no constipation or diarrhea GU: denies dysuria or urinary frquency MSK:  ambulatory without device, no falls reported Skin: denies rashes or wounds Neurological: endorses weakness, denies pain, denies insomnia Psych: Endorses positive mood Heme/lymph/immuno: denies bruises, abnormal bleeding   PHYSICAL EXAM  General: In no acute distress,  Cardiovascular: regular rate and rhythm Pulmonary: no cough, no increased work of breathing, normal respiratory effort on room air Abdomen: soft, non tender, positive bowel sounds in all quadrants GU:  no suprapubic tenderness Eyes: Normal lids, no discharge, sclera anicteric ENMT: Moist mucous membranes; scarred nasal area Musculoskeletal:  no edema in BLE Skin: no rash to visible skin, warm without cyanosis Psych: non-anxious affect Neurological: Weakness but otherwise non focal Heme/lymph/immuno: no bruises, no bleeding  Thank you for the opportunity to participate in the care of Harold Moncus  Please call our office at [phone] if we can be of additional assistance.  Note: Portions of this note were generated with Lobbyist. Dictation errors may occur despite best attempts at proofreading.  Teodoro Spray, NP

## 2020-07-03 NOTE — Telephone Encounter (Signed)
(  4:26p) Palliative Care SW completed a follow-up call to patient per request of NP-L. Eshiet, to provided education and support to patient. SW provided education to patient regarding the [facility] program as he received a denial program. SW provided general information regarding the program, but advised him that he needed to call his caseworker at [facility] for additional information about the denial. Patient explained that he and his wife are struggling financially and is disappointed that he is unable to get any help. SW advised that she will get the information where he could file an appeal and also provide him with any additional information/resources that could be helpful to him.   No other concerns noted.

## 2020-08-19 ENCOUNTER — Other Ambulatory Visit: Payer: Self-pay

## 2020-08-19 ENCOUNTER — Other Ambulatory Visit: Payer: Medicaid Other | Admitting: Hospice

## 2020-08-19 DIAGNOSIS — Z515 Encounter for palliative care: Secondary | ICD-10-CM

## 2020-08-19 DIAGNOSIS — F339 Major depressive disorder, recurrent, unspecified: Secondary | ICD-10-CM

## 2020-08-19 DIAGNOSIS — C300 Malignant neoplasm of nasal cavity: Secondary | ICD-10-CM

## 2020-08-19 NOTE — Progress Notes (Signed)
[facility] Consult Note Telephone: [phone]  Fax: [phone]  PATIENT NAME: Micheal Faulkner DOB: 07-29-1970 MRN: [id number]  PRIMARY CARE PROVIDER: Dr Velna Hatchet: [facility] REFERRING PROVIDER: [name], [facility] [place],  [address] [phone] ENT RESPONSIBLE PARTY:Self Extended Emergency Contact Information Primary Emergency Contact: Daleon, [place] Phone: [phone] Relation: Spouse Patient's cell:[phone]    TELEHEALTH VISIT STATEMENT Due to the COVID-19 crisis, this visit was done via telephone from my office. It was initiated and consented to by this patient and/or family.  Visit is to build trust and highlight Palliative Medicine as specialized medical care for people living with serious illness, aimed at facilitating better quality of life through symptoms relief, assisting with advance care plan and establishing goals of care.   CHIEF COMPLAINT: Follow up palliative visit/Depression  RECOMMENDATIONS/PLAN:   1. Advance Care Planning/Code Status: Patient remains a DNR  2. Goals of Care: Goals of care include to maximize quality of life and symptom management. Extensive MOST discussions today. MOST selections include additional limited interventions, IV fluids as indicated, antibiotics as indicated, feeding tube for a defined trial period. Signed MOST form at home with patient and same document uploaded to Epic.  3. Symptom management/Plan:  Depression: Improving. Continue Wellbutrin as currently ordered.  Continue to see psychiatrist as scheduled and CA support therapist as planned. Continue reading therapy; patient loves to read science fiction which brings him some joy and perspective. Continue to play the guitar and engage in activities that promote good mood and wellbeing.  Squamous cell carcinoma of nasal cavity: Continue  immunotherapy for nasal cancer and follow up with Oncologist as planned.  Insomnia: Patient reports trazodone is effective.  Continue trazodone as ordered, and routine sleep hygiene. Palliative will continue to monitor for symptom management/decline and make recommendations as needed. Return2 months or prn. Encouraged to call provider sooner with any concerns.   HISTORY OF PRESENT ILLNESS:   Follow-up visit for Coley Kulikowski is a 50 y.o. male with multiple medical problems including Depression which is chronic, likely related of 4 squamous cell nasal CA and other comorbidities, reducing his joy of living. Depression is intermittent, currently improving with patient seeing psychiatrist monthly and his medications being tweaked.  He reports he continues on Wellbutrin and Abilify with plan to replace Wellbutrin with Prozac.  He also sees cancer support therapist on a weekly basis and this has been helpful.  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 History obtained from review of EMR, discussion with patient/family. Review and summarization of Epic records shows history from other than patient. Rest of 10 point ROS asked and negative. Palliative Care was asked to follow this patient by consultation request of No ref. provider found to help address complex decision making in the context of goals of care.   CODE STATUS: DNR  PPS: 60%  HOSPICE ELIGIBILITY/DIAGNOSIS: TBD  PAST MEDICAL HISTORY:  Past Medical History:  Diagnosis Date  . Asthma   . Epistaxis, recurrent 12/2016  . H/O seasonal allergies   . History of radiation therapy 04/15/17- 05/31/17   Nasal cavity-tumor bed/ 60 Gy in 30 fractions.   . Hyperlipidemia   . Migraine   . Nasal mass 12/2016     SOCIAL HX: @SOCX  Patient lives at home for ongoing care  FAMILY HX:  Mother:  Breast  CA Sister: Type 2 DM, Obesity  Review lab tests/diagnostics No results for input(s): WBC, HGB, HCT, PLT, MCV in the last 168 hours. No results for input(s): NA, Faulkner, CL, CO2, BUN, CREATININE, GLUCOSE in the last 168 hours. Latest GFR by Cockcroft Gault (not valid in AKI or ESRD) CrCl cannot be calculated (No successful lab value found.).  ALLERGIES: No Known Allergies    PERTINENT MEDICATIONS:  Outpatient Encounter Medications as of 08/19/2020  Medication Sig  . acetaminophen (TYLENOL) 500 MG tablet Take 1,000 mg by mouth.  . ALPRAZolam (XANAX) 0.5 MG tablet Take 0.5 mg by mouth.  Marland Kitchen aspirin 325 MG tablet Take 325 mg by mouth.  . diclofenac (VOLTAREN) 75 MG EC tablet Take 1 tablet (75 mg total) by mouth 2 (two) times daily. (Patient not taking: Reported on 03/31/2017)  . escitalopram (LEXAPRO) 10 MG tablet TAKE 1 TABLET BY MOUTH ONCE DAILY IN THE MORNING  . HYDROcodone-acetaminophen (NORCO/VICODIN) 5-325 MG tablet Take 1 tablet by mouth every 6 (six) hours as needed for moderate pain. (Patient not taking: Reported on 06/16/2017)  . ibuprofen (ADVIL,MOTRIN) 200 MG tablet Take 200 mg by mouth.  . lidocaine (XYLOCAINE) 2 % solution Patient: Mix 1part 2% viscous lidocaine, 1part H20. Swish+spit 57mL of diluted mixture, 8xs a day, PRN soreness. May apply to nostrils PRN.  . Multiple Vitamins-Minerals (MULTIVITAMIN WITH MINERALS) tablet Take 1 tablet by mouth daily.  . rosuvastatin (CRESTOR) 20 MG tablet TAKE 1 TABLET BY MOUTH EVERY EVENING AFTER A MEAL  . sodium fluoride (FLUORISHIELD) 1.1 % GEL dental gel Instill 1 drop of gel per tooth space of fluoride tray.  Place over teeth for 5 minutes.  Remove.  Spit out excess.  Repeat nightly.  . sodium fluoride (PREVIDENT 5000 PLUS) 1.1 % CREA dental cream Apply to tooth brush. Brush teeth for 2 minutes. Spit out excess. Repeat nightly.  . tadalafil (CIALIS) 10 MG tablet Take 10 mg by mouth.  . Testosterone 25 MG/2.5GM (1%) GEL Apply 1 packet  topically daily.  . traZODone (DESYREL) 50 MG tablet TAKE 1 TABLET BY MOUTH EVERY EVENING AT BEDTIME AS NEEDED FOR SLEEP  . V-R NASAL SPRAY SALINE 0.65 % nasal spray   . zolpidem (AMBIEN) 10 MG tablet TAKE 1/2 TABLET BY MOUTH AT BEDTIME AS NEEDED FOR SLEEP   No facility-administered encounter medications on file as of 08/19/2020.   ROS  General: NAD EYES: denies vision changes ENMT: denies dysphagia no xerostomia; front teeth starting to come off loose related to squamous cell carcinoma of nasal cavity; patient has alerted oncologist. Cardiovascular: denies chest pain Pulmonary: denies SOB  Abdomen: endorses fair appetite, no constipation or diarrhea GU: denies dysuria or urinary frquency MSK: ambulatory without device, no falls reported Skin: denies rashes or wounds Neurological: endorses weakness, denies pain, denies insomnia Psych: Endorses positive mood today; continues to see psychiatrist and cancer support therapist Heme/lymph/immuno: denies bruises, abnormal bleeding  I spent 45 minutes providing this consultation; time includes spent with patient/family, chart review and documentation. More than 50% of the time in this consultation was spent on coordinating communication   Thank you for the opportunity to participate in the care of Jsaon Yoo Please call our office at [phone] if we can be of additional assistance.  Note: Portions of this note were generated with Lobbyist. Dictation errors may occur despite best attempts at proofreading.  Teodoro Spray, NP

## 2020-09-30 ENCOUNTER — Other Ambulatory Visit: Payer: Self-pay

## 2020-09-30 ENCOUNTER — Other Ambulatory Visit: Payer: Medicaid Other | Admitting: Hospice

## 2020-09-30 DIAGNOSIS — Z515 Encounter for palliative care: Secondary | ICD-10-CM

## 2020-09-30 DIAGNOSIS — J4520 Mild intermittent asthma, uncomplicated: Secondary | ICD-10-CM

## 2020-09-30 DIAGNOSIS — C300 Malignant neoplasm of nasal cavity: Secondary | ICD-10-CM

## 2020-09-30 NOTE — Progress Notes (Signed)
[place] Consult Note Telephone: [phone]  Fax: [phone]  PATIENT NAME: Micheal Faulkner DOB: [birth year] MRN: [id number]   PRIMARY CARE PROVIDER: Dr Velna Hatchet: [facility] REFERRING PROVIDER: [name], [facility] [place], [place] [address] [phone] ENT RESPONSIBLE PARTY:Self Extended Emergency Contact Information Primary Emergency Contact: Keymarion, [place] Phone: [phone] Relation: Spouse Patient's cell:[phone]  Visit is to build trust and highlight Palliative Medicine as specialized medical care for people living with serious illness, aimed at facilitating better quality of life through symptoms relief, assisting with advance care plan and establishing goals of care.   CHIEF COMPLAINT: Follow up palliative visit/shortness of breath  RECOMMENDATIONS/PLAN:  1. Advance Care Planning/Code Status:Patient remains a DNR  2. Goals of Care: Goals of care include to maximize quality of life and symptom management. MOST selections include additional limited interventions, IV fluids as indicated, antibiotics as indicated, feeding tube for a defined trial period.  3. Symptom management/Plan: Shortness of breath: Related to asthma.  Use rescue inhaler-albuterol nebulization treatment as ordered.  Avoid irritants/triggers.  Education provided on dosing and correct use of treatment, pursed lip breathing.  Pulmonologist consult as needed Depression: Improving. Wellbutrin is discontinued.  Currently on Abilify and Prozac. Continue to see psychiatrist as scheduled and CA support therapist as planned. Continue reading therapy-science fiction, play the guitar and engage in activities that promote good mood and wellbeing.  Squamous cell carcinoma of nasal cavity: Continue [facility] every 3 [facility] at [facility] for nasal cancer and  follow up with Oncologist as planned. Insomnia: Patient reports trazodone is effective.  Continue trazodone as ordered, and routine sleep hygiene.  Routine CBC BMP. Palliative will continue to monitor for symptom management/decline and make recommendations as needed. Return2 months or prn. Encouraged to call provider sooner with any concerns.   HISTORY OF PRESENT ILLNESS: Follow-up visit for Micheal Faulkner a 50 y.o.malewith multiple medical condition including acute on chronic asthma; asthma flare with associated wheezing and shortness of breath.  Patient said he was recently engaged in a lot of outdoor activities, exposed to the weather and he thinks this triggered his asthma.  He reports his asthma is intermittent, worsen this week, impairs his breathing and activities of daily living; breathing treatment is helpful.  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   Review and summarization of Epic records shows history from other than patient. Rest of 10 point ROS asked and negative..  Palliative Care was asked to follow this patient by consultation request of Dr. Merry Proud to help address complex decision making in the context of advance care planning and goals of care clarification.   PPS: 60%  ROS  General: NAD EYES: denies vision changes ENMT: denies dysphagia no xerostomia Cardiovascular: denies chest pain Pulmonary: Endorses occasional wheezing and shortness of breath Abdomen: endorses fair appetite, no constipation or diarrhea GU: denies dysuria or urinary frquency MSK: ambulatory without device, no falls reported Skin: denies rashes or wounds Neurological: endorses weakness, denies pain, denies insomnia Psych: Endorses positive  mood Heme/lymph/immuno: denies bruises, abnormal bleeding   PHYSICAL EXAM BP 130/70 pulse 80 respiration 18 O2 97% room air  General: In no acute distress, appropriately dressed Cardiovascular: regular rate and rhythm; no edema in BLE Pulmonary: no cough, no increased work of  breathing, normal respiratory effort; expiratory wheeze Abdomen: soft, non tender, no guarding, positive bowel sounds in all quadrants GU:  no suprapubic tenderness Eyes: Normal lids, no discharge, sclera anicteric ENMT: Moist mucous membranes;scarred nasal area Musculoskeletal:  Weakness, ambulatory without assistive device Skin: no rash to visible skin, warm without cyanosis,  Psych: non-anxious affect Neurological: Weakness but otherwise non focal Heme/lymph/immuno: no bruises, no bleeding  PERTINENT MEDICATIONS:  Outpatient Encounter Medications as of 09/30/2020  Medication Sig  . acetaminophen (TYLENOL) 500 MG tablet Take 1,000 mg by mouth.  . ALPRAZolam (XANAX) 0.5 MG tablet Take 0.5 mg by mouth.  Marland Kitchen aspirin 325 MG tablet Take 325 mg by mouth.  . diclofenac (VOLTAREN) 75 MG EC tablet Take 1 tablet (75 mg total) by mouth 2 (two) times daily. (Patient not taking: Reported on 03/31/2017)  . escitalopram (LEXAPRO) 10 MG tablet TAKE 1 TABLET BY MOUTH ONCE DAILY IN THE MORNING  . HYDROcodone-acetaminophen (NORCO/VICODIN) 5-325 MG tablet Take 1 tablet by mouth every 6 (six) hours as needed for moderate pain. (Patient not taking: Reported on 06/16/2017)  . ibuprofen (ADVIL,MOTRIN) 200 MG tablet Take 200 mg by mouth.  . lidocaine (XYLOCAINE) 2 % solution Patient: Mix 1part 2% viscous lidocaine, 1part H20. Swish+spit 66mL of diluted mixture, 8xs a day, PRN soreness. May apply to nostrils PRN.  . Multiple Vitamins-Minerals (MULTIVITAMIN WITH MINERALS) tablet Take 1 tablet by mouth daily.  . rosuvastatin (CRESTOR) 20 MG tablet TAKE 1 TABLET BY MOUTH EVERY EVENING AFTER A MEAL  . sodium fluoride (FLUORISHIELD) 1.1 % GEL  dental gel Instill 1 drop of gel per tooth space of fluoride tray.  Place over teeth for 5 minutes.  Remove.  Spit out excess.  Repeat nightly.  . sodium fluoride (PREVIDENT 5000 PLUS) 1.1 % CREA dental cream Apply to tooth brush. Brush teeth for 2 minutes. Spit out excess. Repeat nightly.  . tadalafil (CIALIS) 10 MG tablet Take 10 mg by mouth.  . Testosterone 25 MG/2.5GM (1%) GEL Apply 1 packet topically daily.  . traZODone (DESYREL) 50 MG tablet TAKE 1 TABLET BY MOUTH EVERY EVENING AT BEDTIME AS NEEDED FOR SLEEP  . V-R NASAL SPRAY SALINE 0.65 % nasal spray   . zolpidem (AMBIEN) 10 MG tablet TAKE 1/2 TABLET BY MOUTH AT BEDTIME AS NEEDED FOR SLEEP   No facility-administered encounter medications on file as of 09/30/2020.    HOSPICE ELIGIBILITY/DIAGNOSIS: TBD  PAST MEDICAL HISTORY:  Past Medical History:  Diagnosis Date  . Asthma   . Epistaxis, recurrent 12/2016  . H/O seasonal allergies   . History of radiation therapy 04/15/17- 05/31/17   Nasal cavity-tumor bed/ 60 Gy in 30 fractions.   . Hyperlipidemia   . Migraine   . Nasal mass 12/2016     SOCIAL HX: @SOCX  Patient lives at home with spouse  for ongoing care  FAMILY HX: No family history on file.  ALLERGIES: No Known Allergies    I spent 50 minutes providing this consultation; this includes time spent with patient/family, chart review and documentation. More than 50% of the time in this consultation was spent on counseling and coordinating communication   Thank you for the opportunity to participate in the care of Micheal Faulkner Please call our office at [phone] if we can be of additional assistance.  Note: Portions of this note were generated with Lobbyist. Dictation errors may occur despite best attempts at proofreading.  Teodoro Spray, NP

## 2020-11-21 DIAGNOSIS — R110 Nausea: Secondary | ICD-10-CM | POA: Diagnosis not present

## 2020-11-21 DIAGNOSIS — Z95828 Presence of other vascular implants and grafts: Secondary | ICD-10-CM | POA: Diagnosis not present

## 2020-11-21 DIAGNOSIS — C300 Malignant neoplasm of nasal cavity: Secondary | ICD-10-CM | POA: Diagnosis not present

## 2020-11-21 DIAGNOSIS — G893 Neoplasm related pain (acute) (chronic): Secondary | ICD-10-CM | POA: Diagnosis not present

## 2020-11-21 DIAGNOSIS — C7801 Secondary malignant neoplasm of right lung: Secondary | ICD-10-CM | POA: Diagnosis not present

## 2020-11-21 DIAGNOSIS — Z5112 Encounter for antineoplastic immunotherapy: Secondary | ICD-10-CM | POA: Diagnosis not present

## 2020-11-21 DIAGNOSIS — R21 Rash and other nonspecific skin eruption: Secondary | ICD-10-CM | POA: Diagnosis not present

## 2020-11-21 DIAGNOSIS — Z79899 Other long term (current) drug therapy: Secondary | ICD-10-CM | POA: Diagnosis not present

## 2020-11-21 DIAGNOSIS — F0631 Mood disorder due to known physiological condition with depressive features: Secondary | ICD-10-CM | POA: Diagnosis not present

## 2020-11-21 DIAGNOSIS — M950 Acquired deformity of nose: Secondary | ICD-10-CM | POA: Diagnosis not present

## 2020-11-26 DIAGNOSIS — F439 Reaction to severe stress, unspecified: Secondary | ICD-10-CM | POA: Diagnosis not present

## 2020-11-26 DIAGNOSIS — F4321 Adjustment disorder with depressed mood: Secondary | ICD-10-CM | POA: Diagnosis not present

## 2020-11-26 DIAGNOSIS — F322 Major depressive disorder, single episode, severe without psychotic features: Secondary | ICD-10-CM | POA: Diagnosis not present

## 2020-11-26 DIAGNOSIS — Z79899 Other long term (current) drug therapy: Secondary | ICD-10-CM | POA: Diagnosis not present

## 2020-12-03 DIAGNOSIS — E785 Hyperlipidemia, unspecified: Secondary | ICD-10-CM | POA: Diagnosis not present

## 2020-12-03 DIAGNOSIS — E291 Testicular hypofunction: Secondary | ICD-10-CM | POA: Diagnosis not present

## 2020-12-03 DIAGNOSIS — Z125 Encounter for screening for malignant neoplasm of prostate: Secondary | ICD-10-CM | POA: Diagnosis not present

## 2020-12-06 DIAGNOSIS — Z1331 Encounter for screening for depression: Secondary | ICD-10-CM | POA: Diagnosis not present

## 2020-12-06 DIAGNOSIS — F330 Major depressive disorder, recurrent, mild: Secondary | ICD-10-CM | POA: Diagnosis not present

## 2020-12-06 DIAGNOSIS — F419 Anxiety disorder, unspecified: Secondary | ICD-10-CM | POA: Diagnosis not present

## 2020-12-06 DIAGNOSIS — G4700 Insomnia, unspecified: Secondary | ICD-10-CM | POA: Diagnosis not present

## 2020-12-06 DIAGNOSIS — Z1339 Encounter for screening examination for other mental health and behavioral disorders: Secondary | ICD-10-CM | POA: Diagnosis not present

## 2020-12-06 DIAGNOSIS — C760 Malignant neoplasm of head, face and neck: Secondary | ICD-10-CM | POA: Diagnosis not present

## 2020-12-06 DIAGNOSIS — E785 Hyperlipidemia, unspecified: Secondary | ICD-10-CM | POA: Diagnosis not present

## 2020-12-06 DIAGNOSIS — Z23 Encounter for immunization: Secondary | ICD-10-CM | POA: Diagnosis not present

## 2020-12-06 DIAGNOSIS — Z0000 Encounter for general adult medical examination without abnormal findings: Secondary | ICD-10-CM | POA: Diagnosis not present

## 2020-12-12 DIAGNOSIS — G893 Neoplasm related pain (acute) (chronic): Secondary | ICD-10-CM | POA: Diagnosis not present

## 2020-12-12 DIAGNOSIS — Z95828 Presence of other vascular implants and grafts: Secondary | ICD-10-CM | POA: Diagnosis not present

## 2020-12-12 DIAGNOSIS — C300 Malignant neoplasm of nasal cavity: Secondary | ICD-10-CM | POA: Diagnosis not present

## 2020-12-12 DIAGNOSIS — R21 Rash and other nonspecific skin eruption: Secondary | ICD-10-CM | POA: Diagnosis not present

## 2020-12-12 DIAGNOSIS — Z5112 Encounter for antineoplastic immunotherapy: Secondary | ICD-10-CM | POA: Diagnosis not present

## 2020-12-12 DIAGNOSIS — R0602 Shortness of breath: Secondary | ICD-10-CM | POA: Diagnosis not present

## 2020-12-12 DIAGNOSIS — R059 Cough, unspecified: Secondary | ICD-10-CM | POA: Diagnosis not present

## 2020-12-12 DIAGNOSIS — Z79899 Other long term (current) drug therapy: Secondary | ICD-10-CM | POA: Diagnosis not present

## 2020-12-12 DIAGNOSIS — F0631 Mood disorder due to known physiological condition with depressive features: Secondary | ICD-10-CM | POA: Diagnosis not present

## 2020-12-12 DIAGNOSIS — R110 Nausea: Secondary | ICD-10-CM | POA: Diagnosis not present

## 2021-01-02 DIAGNOSIS — C300 Malignant neoplasm of nasal cavity: Secondary | ICD-10-CM | POA: Diagnosis not present

## 2021-01-02 DIAGNOSIS — Z5112 Encounter for antineoplastic immunotherapy: Secondary | ICD-10-CM | POA: Diagnosis not present

## 2021-01-02 DIAGNOSIS — Z95828 Presence of other vascular implants and grafts: Secondary | ICD-10-CM | POA: Diagnosis not present

## 2021-01-06 DIAGNOSIS — F4321 Adjustment disorder with depressed mood: Secondary | ICD-10-CM | POA: Diagnosis not present

## 2021-01-06 DIAGNOSIS — Z79899 Other long term (current) drug therapy: Secondary | ICD-10-CM | POA: Diagnosis not present

## 2021-01-06 DIAGNOSIS — F322 Major depressive disorder, single episode, severe without psychotic features: Secondary | ICD-10-CM | POA: Diagnosis not present

## 2021-01-06 DIAGNOSIS — F439 Reaction to severe stress, unspecified: Secondary | ICD-10-CM | POA: Diagnosis not present

## 2021-01-23 DIAGNOSIS — Z79899 Other long term (current) drug therapy: Secondary | ICD-10-CM | POA: Diagnosis not present

## 2021-01-23 DIAGNOSIS — C300 Malignant neoplasm of nasal cavity: Secondary | ICD-10-CM | POA: Diagnosis not present

## 2021-01-23 DIAGNOSIS — Z5112 Encounter for antineoplastic immunotherapy: Secondary | ICD-10-CM | POA: Diagnosis not present

## 2021-01-23 DIAGNOSIS — Z95828 Presence of other vascular implants and grafts: Secondary | ICD-10-CM | POA: Diagnosis not present

## 2021-02-05 ENCOUNTER — Other Ambulatory Visit: Payer: Self-pay

## 2021-02-05 ENCOUNTER — Other Ambulatory Visit: Payer: Medicaid Other | Admitting: Hospice

## 2021-02-05 DIAGNOSIS — G4700 Insomnia, unspecified: Secondary | ICD-10-CM

## 2021-02-05 DIAGNOSIS — F339 Major depressive disorder, recurrent, unspecified: Secondary | ICD-10-CM

## 2021-02-05 DIAGNOSIS — C300 Malignant neoplasm of nasal cavity: Secondary | ICD-10-CM

## 2021-02-05 DIAGNOSIS — Z515 Encounter for palliative care: Secondary | ICD-10-CM

## 2021-02-05 NOTE — Progress Notes (Signed)
[facility] Palliative Care Consult Note Telephone: [phone]  Fax: [phone]  PATIENT NAME: Micheal Faulkner DOB: November 06, 1970 MRN: [id number]  PRIMARY CARE PROVIDER:    Dr Velna Hatchet: [facility] REFERRING PROVIDER:   [name], [facility] [place],  [place] [address] [phone]  ENT RESPONSIBLE PARTY:  Self  Extended Emergency Contact Information Primary Emergency Contact: Hosey, [place] Phone: [phone] Relation: Spouse  Patient's cell: [phone]  best to call Contact Information     Name Relation Home Work Mobile   [place] Spouse [phone]       TELEHEALTH VISIT STATEMENT Due to the COVID-19 crisis, this visit was done via telemedicine and it was initiated and consent by this patient and or family. Video-audio (telehealth) contact was unable to be done due to technical barriers from the patient's side.   Visit is to build trust and highlight Palliative Medicine as specialized medical care for people living with serious illness, aimed at facilitating better quality of life through symptoms relief, assisting with advance care planning and complex medical decision making. This is a follow up visit.  RECOMMENDATIONS/PLAN:   Advance Care Planning/Code Status: Patient  remains a DNR  Goals of Care: Goals of care include to maximize quality of life and symptom management. MOST selections include additional limited interventions, IV fluids as indicated, antibiotics as indicated, feeding tube for a defined trial period.   Palliative care team will continue to support patient, patient's family, and medical team.  Symptom management/Plan:  Shortness of breath: Resolved at this. Use rescue inhaler-albuterol nebulization treatment prn as ordered.  Avoid irritants/triggers.   Depression: Improving. Followed by Psychiatrist. Currently on Adderal and Prozac. Continue to see  psychiatrist as scheduled and [place] support therapist as planned. He has more energy and able to help out in cooking and cleaning. Continue reading therapy-science fiction, play the guitar and engage in activities that promote good mood and wellbeing.  Squamous cell carcinoma of nasal cavity: Continue immunotherapy every 3 weeks at [facility] for nasal cancer and follow up with Oncologist as planned. Next appointment is 10/202022; possible discussion with Oncologist on spacing out treatment a little longer.  Insomnia: Patient reports trazodone is effective.  Continue trazodone as ordered, and routine sleep hygiene.  Palliative will continue to monitor for symptom management/decline and make recommendations as needed. Return 4 weeks or prn. Encouraged to call provider sooner with any concerns.    HISTORY OF PRESENT ILLNESS:   Follow-up visit for Micheal Faulkner is a 50 y.o. 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  Patient reports doing well overall, denies respiratory distress pain or discomfort.  He reports having more energy since he was started on Adderall by his psychiatrist.  History obtained from review of EMR, discussion with primary team, family and/or patient. Records reviewed and summarized above. All 10 point systems reviewed and are negative except as documented in history of present illness above  Review and summarization of Epic records shows history from other than patient.   Palliative Care was asked to follow this patient o help address complex decision making in the context of advance care planning and goals of care clarification.   PERTINENT MEDICATIONS:  Outpatient Encounter Medications as of 02/05/2021  Medication Sig   acetaminophen (  TYLENOL) 500 MG tablet  Take 1,000 mg by mouth.   ALPRAZolam (XANAX) 0.5 MG tablet Take 0.5 mg by mouth.   aspirin 325 MG tablet Take 325 mg by mouth.   diclofenac (VOLTAREN) 75 MG EC tablet Take 1 tablet (75 mg total) by mouth 2 (two) times daily. (Patient not taking: Reported on 03/31/2017)   escitalopram (LEXAPRO) 10 MG tablet TAKE 1 TABLET BY MOUTH ONCE DAILY IN THE MORNING   HYDROcodone-acetaminophen (NORCO/VICODIN) 5-325 MG tablet Take 1 tablet by mouth every 6 (six) hours as needed for moderate pain. (Patient not taking: Reported on 06/16/2017)   ibuprofen (ADVIL,MOTRIN) 200 MG tablet Take 200 mg by mouth.   lidocaine (XYLOCAINE) 2 % solution Patient: Mix 1part 2% viscous lidocaine, 1part H20. Swish+spit 57mL of diluted mixture, 8xs a day, PRN soreness. May apply to nostrils PRN.   Multiple Vitamins-Minerals (MULTIVITAMIN WITH MINERALS) tablet Take 1 tablet by mouth daily.   rosuvastatin (CRESTOR) 20 MG tablet TAKE 1 TABLET BY MOUTH EVERY EVENING AFTER A MEAL   sodium fluoride (FLUORISHIELD) 1.1 % GEL dental gel Instill 1 drop of gel per tooth space of fluoride tray.  Place over teeth for 5 minutes.  Remove.  Spit out excess.  Repeat nightly.   sodium fluoride (PREVIDENT 5000 PLUS) 1.1 % CREA dental cream Apply to tooth brush. Brush teeth for 2 minutes. Spit out excess. Repeat nightly.   tadalafil (CIALIS) 10 MG tablet Take 10 mg by mouth.   Testosterone 25 MG/2.5GM (1%) GEL Apply 1 packet topically daily.   traZODone (DESYREL) 50 MG tablet TAKE 1 TABLET BY MOUTH EVERY EVENING AT BEDTIME AS NEEDED FOR SLEEP   V-R NASAL SPRAY SALINE 0.65 % nasal spray    zolpidem (AMBIEN) 10 MG tablet TAKE 1/2 TABLET BY MOUTH AT BEDTIME AS NEEDED FOR SLEEP   No facility-administered encounter medications on file as of 02/05/2021.    HOSPICE ELIGIBILITY/DIAGNOSIS: TBD  PAST MEDICAL HISTORY:  Past Medical History:  Diagnosis Date   Asthma    Epistaxis, recurrent 12/2016   H/O seasonal allergies    History of radiation therapy  04/15/17- 05/31/17   Nasal cavity-tumor bed/ 60 Gy in 30 fractions.    Hyperlipidemia    Migraine    Nasal mass 12/2016     Review lab tests/diagnostics No results for input(s): WBC, HGB, HCT, PLT, MCV in the last 168 hours. No results for input(s): NA, K, CL, CO2, BUN, CREATININE, GLUCOSE in the last 168 hours. CrCl cannot be calculated (No successful lab value found.).  ALLERGIES: No Known Allergies    I spent 40 minutes providing this consultation; this includes time spent with patient/family, chart review and documentation. More than 50% of the time in this consultation was spent on counseling and coordinating communication   Thank you for the opportunity to participate in the care of Micheal Faulkner Please call our office at [phone] if we can be of additional assistance.  Note: Portions of this note were generated with Lobbyist. Dictation errors may occur despite best attempts at proofreading.  Teodoro Spray, NP

## 2021-02-17 DIAGNOSIS — F439 Reaction to severe stress, unspecified: Secondary | ICD-10-CM | POA: Diagnosis not present

## 2021-02-17 DIAGNOSIS — F4321 Adjustment disorder with depressed mood: Secondary | ICD-10-CM | POA: Diagnosis not present

## 2021-02-17 DIAGNOSIS — F322 Major depressive disorder, single episode, severe without psychotic features: Secondary | ICD-10-CM | POA: Diagnosis not present

## 2021-02-17 DIAGNOSIS — Z79899 Other long term (current) drug therapy: Secondary | ICD-10-CM | POA: Diagnosis not present

## 2021-02-24 DIAGNOSIS — Z7960 Long term (current) use of unspecified immunomodulators and immunosuppressants: Secondary | ICD-10-CM | POA: Diagnosis not present

## 2021-02-24 DIAGNOSIS — C300 Malignant neoplasm of nasal cavity: Secondary | ICD-10-CM | POA: Diagnosis not present

## 2021-02-27 DIAGNOSIS — F0631 Mood disorder due to known physiological condition with depressive features: Secondary | ICD-10-CM | POA: Diagnosis not present

## 2021-02-27 DIAGNOSIS — J4520 Mild intermittent asthma, uncomplicated: Secondary | ICD-10-CM | POA: Diagnosis not present

## 2021-02-27 DIAGNOSIS — Z95828 Presence of other vascular implants and grafts: Secondary | ICD-10-CM | POA: Diagnosis not present

## 2021-02-27 DIAGNOSIS — R110 Nausea: Secondary | ICD-10-CM | POA: Diagnosis not present

## 2021-02-27 DIAGNOSIS — F419 Anxiety disorder, unspecified: Secondary | ICD-10-CM | POA: Diagnosis not present

## 2021-02-27 DIAGNOSIS — R21 Rash and other nonspecific skin eruption: Secondary | ICD-10-CM | POA: Diagnosis not present

## 2021-02-27 DIAGNOSIS — J3489 Other specified disorders of nose and nasal sinuses: Secondary | ICD-10-CM | POA: Diagnosis not present

## 2021-02-27 DIAGNOSIS — R918 Other nonspecific abnormal finding of lung field: Secondary | ICD-10-CM | POA: Diagnosis not present

## 2021-02-27 DIAGNOSIS — G893 Neoplasm related pain (acute) (chronic): Secondary | ICD-10-CM | POA: Diagnosis not present

## 2021-02-27 DIAGNOSIS — C300 Malignant neoplasm of nasal cavity: Secondary | ICD-10-CM | POA: Diagnosis not present

## 2021-02-27 DIAGNOSIS — Z79899 Other long term (current) drug therapy: Secondary | ICD-10-CM | POA: Diagnosis not present

## 2021-03-10 ENCOUNTER — Other Ambulatory Visit: Payer: Medicaid Other | Admitting: Hospice

## 2021-03-10 ENCOUNTER — Other Ambulatory Visit: Payer: Self-pay

## 2021-03-10 DIAGNOSIS — C300 Malignant neoplasm of nasal cavity: Secondary | ICD-10-CM | POA: Diagnosis not present

## 2021-03-18 DIAGNOSIS — F322 Major depressive disorder, single episode, severe without psychotic features: Secondary | ICD-10-CM | POA: Diagnosis not present

## 2021-03-18 DIAGNOSIS — F439 Reaction to severe stress, unspecified: Secondary | ICD-10-CM | POA: Diagnosis not present

## 2021-03-18 DIAGNOSIS — Z79899 Other long term (current) drug therapy: Secondary | ICD-10-CM | POA: Diagnosis not present

## 2021-03-18 DIAGNOSIS — F4321 Adjustment disorder with depressed mood: Secondary | ICD-10-CM | POA: Diagnosis not present

## 2021-04-10 DIAGNOSIS — R110 Nausea: Secondary | ICD-10-CM | POA: Diagnosis not present

## 2021-04-10 DIAGNOSIS — Z87891 Personal history of nicotine dependence: Secondary | ICD-10-CM | POA: Diagnosis not present

## 2021-04-10 DIAGNOSIS — R530 Neoplastic (malignant) related fatigue: Secondary | ICD-10-CM | POA: Diagnosis not present

## 2021-04-10 DIAGNOSIS — Z95828 Presence of other vascular implants and grafts: Secondary | ICD-10-CM | POA: Diagnosis not present

## 2021-04-10 DIAGNOSIS — C760 Malignant neoplasm of head, face and neck: Secondary | ICD-10-CM | POA: Diagnosis not present

## 2021-04-10 DIAGNOSIS — G35 Multiple sclerosis: Secondary | ICD-10-CM | POA: Diagnosis not present

## 2021-04-10 DIAGNOSIS — R21 Rash and other nonspecific skin eruption: Secondary | ICD-10-CM | POA: Diagnosis not present

## 2021-04-10 DIAGNOSIS — Z0389 Encounter for observation for other suspected diseases and conditions ruled out: Secondary | ICD-10-CM | POA: Diagnosis not present

## 2021-04-10 DIAGNOSIS — R918 Other nonspecific abnormal finding of lung field: Secondary | ICD-10-CM | POA: Diagnosis not present

## 2021-04-10 DIAGNOSIS — J45909 Unspecified asthma, uncomplicated: Secondary | ICD-10-CM | POA: Diagnosis not present

## 2021-04-10 DIAGNOSIS — J849 Interstitial pulmonary disease, unspecified: Secondary | ICD-10-CM | POA: Diagnosis not present

## 2021-04-10 DIAGNOSIS — C300 Malignant neoplasm of nasal cavity: Secondary | ICD-10-CM | POA: Diagnosis not present

## 2021-04-10 DIAGNOSIS — J45998 Other asthma: Secondary | ICD-10-CM | POA: Diagnosis not present

## 2021-04-10 DIAGNOSIS — Z5112 Encounter for antineoplastic immunotherapy: Secondary | ICD-10-CM | POA: Diagnosis not present

## 2021-04-10 DIAGNOSIS — F0631 Mood disorder due to known physiological condition with depressive features: Secondary | ICD-10-CM | POA: Diagnosis not present

## 2021-04-10 DIAGNOSIS — G893 Neoplasm related pain (acute) (chronic): Secondary | ICD-10-CM | POA: Diagnosis not present

## 2021-05-07 DIAGNOSIS — F322 Major depressive disorder, single episode, severe without psychotic features: Secondary | ICD-10-CM | POA: Diagnosis not present

## 2021-05-07 DIAGNOSIS — F4321 Adjustment disorder with depressed mood: Secondary | ICD-10-CM | POA: Diagnosis not present

## 2021-05-07 DIAGNOSIS — Z79899 Other long term (current) drug therapy: Secondary | ICD-10-CM | POA: Diagnosis not present

## 2021-05-07 DIAGNOSIS — F439 Reaction to severe stress, unspecified: Secondary | ICD-10-CM | POA: Diagnosis not present

## 2021-05-22 DIAGNOSIS — R911 Solitary pulmonary nodule: Secondary | ICD-10-CM | POA: Diagnosis not present

## 2021-05-22 DIAGNOSIS — F0631 Mood disorder due to known physiological condition with depressive features: Secondary | ICD-10-CM | POA: Diagnosis not present

## 2021-05-22 DIAGNOSIS — G893 Neoplasm related pain (acute) (chronic): Secondary | ICD-10-CM | POA: Diagnosis not present

## 2021-05-22 DIAGNOSIS — Z5112 Encounter for antineoplastic immunotherapy: Secondary | ICD-10-CM | POA: Diagnosis not present

## 2021-05-22 DIAGNOSIS — J849 Interstitial pulmonary disease, unspecified: Secondary | ICD-10-CM | POA: Diagnosis not present

## 2021-05-22 DIAGNOSIS — L299 Pruritus, unspecified: Secondary | ICD-10-CM | POA: Diagnosis not present

## 2021-05-22 DIAGNOSIS — J45998 Other asthma: Secondary | ICD-10-CM | POA: Diagnosis not present

## 2021-05-22 DIAGNOSIS — R21 Rash and other nonspecific skin eruption: Secondary | ICD-10-CM | POA: Diagnosis not present

## 2021-05-22 DIAGNOSIS — C300 Malignant neoplasm of nasal cavity: Secondary | ICD-10-CM | POA: Diagnosis not present

## 2021-05-22 DIAGNOSIS — F32A Depression, unspecified: Secondary | ICD-10-CM | POA: Diagnosis not present

## 2021-05-22 DIAGNOSIS — Z95828 Presence of other vascular implants and grafts: Secondary | ICD-10-CM | POA: Diagnosis not present

## 2021-05-22 DIAGNOSIS — R110 Nausea: Secondary | ICD-10-CM | POA: Diagnosis not present

## 2021-05-22 DIAGNOSIS — J45909 Unspecified asthma, uncomplicated: Secondary | ICD-10-CM | POA: Diagnosis not present

## 2021-05-22 DIAGNOSIS — R530 Neoplastic (malignant) related fatigue: Secondary | ICD-10-CM | POA: Diagnosis not present

## 2021-05-22 DIAGNOSIS — R5383 Other fatigue: Secondary | ICD-10-CM | POA: Diagnosis not present

## 2021-06-03 DIAGNOSIS — F4321 Adjustment disorder with depressed mood: Secondary | ICD-10-CM | POA: Diagnosis not present

## 2021-06-03 DIAGNOSIS — Z79899 Other long term (current) drug therapy: Secondary | ICD-10-CM | POA: Diagnosis not present

## 2021-06-03 DIAGNOSIS — F439 Reaction to severe stress, unspecified: Secondary | ICD-10-CM | POA: Diagnosis not present

## 2021-06-03 DIAGNOSIS — F322 Major depressive disorder, single episode, severe without psychotic features: Secondary | ICD-10-CM | POA: Diagnosis not present

## 2021-06-12 DIAGNOSIS — C300 Malignant neoplasm of nasal cavity: Secondary | ICD-10-CM | POA: Diagnosis not present

## 2021-06-12 DIAGNOSIS — Z5112 Encounter for antineoplastic immunotherapy: Secondary | ICD-10-CM | POA: Diagnosis not present

## 2021-06-12 DIAGNOSIS — Z95828 Presence of other vascular implants and grafts: Secondary | ICD-10-CM | POA: Diagnosis not present

## 2021-06-20 DIAGNOSIS — Z87891 Personal history of nicotine dependence: Secondary | ICD-10-CM | POA: Diagnosis not present

## 2021-06-20 DIAGNOSIS — C7801 Secondary malignant neoplasm of right lung: Secondary | ICD-10-CM | POA: Diagnosis not present

## 2021-06-20 DIAGNOSIS — J984 Other disorders of lung: Secondary | ICD-10-CM | POA: Diagnosis not present

## 2021-06-20 DIAGNOSIS — C300 Malignant neoplasm of nasal cavity: Secondary | ICD-10-CM | POA: Diagnosis not present

## 2021-06-20 DIAGNOSIS — Z8522 Personal history of malignant neoplasm of nasal cavities, middle ear, and accessory sinuses: Secondary | ICD-10-CM | POA: Diagnosis not present

## 2021-06-20 DIAGNOSIS — R918 Other nonspecific abnormal finding of lung field: Secondary | ICD-10-CM | POA: Diagnosis not present

## 2021-06-20 DIAGNOSIS — C3411 Malignant neoplasm of upper lobe, right bronchus or lung: Secondary | ICD-10-CM | POA: Diagnosis not present

## 2021-07-03 DIAGNOSIS — Z79899 Other long term (current) drug therapy: Secondary | ICD-10-CM | POA: Diagnosis not present

## 2021-07-03 DIAGNOSIS — J849 Interstitial pulmonary disease, unspecified: Secondary | ICD-10-CM | POA: Diagnosis not present

## 2021-07-03 DIAGNOSIS — C300 Malignant neoplasm of nasal cavity: Secondary | ICD-10-CM | POA: Diagnosis not present

## 2021-07-03 DIAGNOSIS — Z5112 Encounter for antineoplastic immunotherapy: Secondary | ICD-10-CM | POA: Diagnosis not present

## 2021-07-03 DIAGNOSIS — Z95828 Presence of other vascular implants and grafts: Secondary | ICD-10-CM | POA: Diagnosis not present

## 2021-07-03 DIAGNOSIS — F0631 Mood disorder due to known physiological condition with depressive features: Secondary | ICD-10-CM | POA: Diagnosis not present

## 2021-07-03 DIAGNOSIS — R21 Rash and other nonspecific skin eruption: Secondary | ICD-10-CM | POA: Diagnosis not present

## 2021-07-03 DIAGNOSIS — J45909 Unspecified asthma, uncomplicated: Secondary | ICD-10-CM | POA: Diagnosis not present

## 2021-07-03 DIAGNOSIS — R530 Neoplastic (malignant) related fatigue: Secondary | ICD-10-CM | POA: Diagnosis not present

## 2021-07-03 DIAGNOSIS — R110 Nausea: Secondary | ICD-10-CM | POA: Diagnosis not present

## 2021-07-03 DIAGNOSIS — L299 Pruritus, unspecified: Secondary | ICD-10-CM | POA: Diagnosis not present

## 2021-07-03 DIAGNOSIS — G893 Neoplasm related pain (acute) (chronic): Secondary | ICD-10-CM | POA: Diagnosis not present

## 2021-07-11 DIAGNOSIS — F4321 Adjustment disorder with depressed mood: Secondary | ICD-10-CM | POA: Diagnosis not present

## 2021-07-11 DIAGNOSIS — Z79899 Other long term (current) drug therapy: Secondary | ICD-10-CM | POA: Diagnosis not present

## 2021-07-11 DIAGNOSIS — F322 Major depressive disorder, single episode, severe without psychotic features: Secondary | ICD-10-CM | POA: Diagnosis not present

## 2021-07-11 DIAGNOSIS — F439 Reaction to severe stress, unspecified: Secondary | ICD-10-CM | POA: Diagnosis not present

## 2021-07-24 DIAGNOSIS — F32A Depression, unspecified: Secondary | ICD-10-CM | POA: Diagnosis not present

## 2021-07-24 DIAGNOSIS — G893 Neoplasm related pain (acute) (chronic): Secondary | ICD-10-CM | POA: Diagnosis not present

## 2021-07-24 DIAGNOSIS — R530 Neoplastic (malignant) related fatigue: Secondary | ICD-10-CM | POA: Diagnosis not present

## 2021-07-24 DIAGNOSIS — Z5112 Encounter for antineoplastic immunotherapy: Secondary | ICD-10-CM | POA: Diagnosis not present

## 2021-07-24 DIAGNOSIS — Z87891 Personal history of nicotine dependence: Secondary | ICD-10-CM | POA: Diagnosis not present

## 2021-07-24 DIAGNOSIS — M950 Acquired deformity of nose: Secondary | ICD-10-CM | POA: Diagnosis not present

## 2021-07-24 DIAGNOSIS — F0631 Mood disorder due to known physiological condition with depressive features: Secondary | ICD-10-CM | POA: Diagnosis not present

## 2021-07-24 DIAGNOSIS — J45909 Unspecified asthma, uncomplicated: Secondary | ICD-10-CM | POA: Diagnosis not present

## 2021-07-24 DIAGNOSIS — R911 Solitary pulmonary nodule: Secondary | ICD-10-CM | POA: Diagnosis not present

## 2021-07-24 DIAGNOSIS — C300 Malignant neoplasm of nasal cavity: Secondary | ICD-10-CM | POA: Diagnosis not present

## 2021-07-24 DIAGNOSIS — R110 Nausea: Secondary | ICD-10-CM | POA: Diagnosis not present

## 2021-07-24 DIAGNOSIS — R5383 Other fatigue: Secondary | ICD-10-CM | POA: Diagnosis not present

## 2021-07-24 DIAGNOSIS — R21 Rash and other nonspecific skin eruption: Secondary | ICD-10-CM | POA: Diagnosis not present

## 2021-07-24 DIAGNOSIS — R918 Other nonspecific abnormal finding of lung field: Secondary | ICD-10-CM | POA: Diagnosis not present

## 2021-07-24 DIAGNOSIS — J849 Interstitial pulmonary disease, unspecified: Secondary | ICD-10-CM | POA: Diagnosis not present

## 2021-08-14 DIAGNOSIS — Z95828 Presence of other vascular implants and grafts: Secondary | ICD-10-CM | POA: Diagnosis not present

## 2021-08-14 DIAGNOSIS — Z5111 Encounter for antineoplastic chemotherapy: Secondary | ICD-10-CM | POA: Diagnosis not present

## 2021-08-14 DIAGNOSIS — C300 Malignant neoplasm of nasal cavity: Secondary | ICD-10-CM | POA: Diagnosis not present

## 2021-08-14 DIAGNOSIS — Z79899 Other long term (current) drug therapy: Secondary | ICD-10-CM | POA: Diagnosis not present

## 2021-08-25 DIAGNOSIS — C300 Malignant neoplasm of nasal cavity: Secondary | ICD-10-CM | POA: Diagnosis not present

## 2021-08-25 DIAGNOSIS — Z08 Encounter for follow-up examination after completed treatment for malignant neoplasm: Secondary | ICD-10-CM | POA: Diagnosis not present

## 2021-08-25 DIAGNOSIS — Z8522 Personal history of malignant neoplasm of nasal cavities, middle ear, and accessory sinuses: Secondary | ICD-10-CM | POA: Diagnosis not present

## 2021-09-01 DIAGNOSIS — Z79899 Other long term (current) drug therapy: Secondary | ICD-10-CM | POA: Diagnosis not present

## 2021-09-01 DIAGNOSIS — F322 Major depressive disorder, single episode, severe without psychotic features: Secondary | ICD-10-CM | POA: Diagnosis not present

## 2021-09-01 DIAGNOSIS — F439 Reaction to severe stress, unspecified: Secondary | ICD-10-CM | POA: Diagnosis not present

## 2021-09-01 DIAGNOSIS — F4321 Adjustment disorder with depressed mood: Secondary | ICD-10-CM | POA: Diagnosis not present

## 2021-09-04 DIAGNOSIS — Z5112 Encounter for antineoplastic immunotherapy: Secondary | ICD-10-CM | POA: Diagnosis not present

## 2021-09-04 DIAGNOSIS — C300 Malignant neoplasm of nasal cavity: Secondary | ICD-10-CM | POA: Diagnosis not present

## 2021-09-19 DIAGNOSIS — M950 Acquired deformity of nose: Secondary | ICD-10-CM | POA: Diagnosis not present

## 2021-09-19 DIAGNOSIS — R590 Localized enlarged lymph nodes: Secondary | ICD-10-CM | POA: Diagnosis not present

## 2021-09-19 DIAGNOSIS — R911 Solitary pulmonary nodule: Secondary | ICD-10-CM | POA: Diagnosis not present

## 2021-09-19 DIAGNOSIS — Z48813 Encounter for surgical aftercare following surgery on the respiratory system: Secondary | ICD-10-CM | POA: Diagnosis not present

## 2021-09-19 DIAGNOSIS — R9389 Abnormal findings on diagnostic imaging of other specified body structures: Secondary | ICD-10-CM | POA: Diagnosis not present

## 2021-09-19 DIAGNOSIS — C300 Malignant neoplasm of nasal cavity: Secondary | ICD-10-CM | POA: Diagnosis not present

## 2021-09-25 DIAGNOSIS — F32A Depression, unspecified: Secondary | ICD-10-CM | POA: Diagnosis not present

## 2021-09-25 DIAGNOSIS — Z95828 Presence of other vascular implants and grafts: Secondary | ICD-10-CM | POA: Diagnosis not present

## 2021-09-25 DIAGNOSIS — M19041 Primary osteoarthritis, right hand: Secondary | ICD-10-CM | POA: Diagnosis not present

## 2021-09-25 DIAGNOSIS — Z5112 Encounter for antineoplastic immunotherapy: Secondary | ICD-10-CM | POA: Diagnosis not present

## 2021-09-25 DIAGNOSIS — L299 Pruritus, unspecified: Secondary | ICD-10-CM | POA: Diagnosis not present

## 2021-09-25 DIAGNOSIS — R918 Other nonspecific abnormal finding of lung field: Secondary | ICD-10-CM | POA: Diagnosis not present

## 2021-09-25 DIAGNOSIS — C300 Malignant neoplasm of nasal cavity: Secondary | ICD-10-CM | POA: Diagnosis not present

## 2021-09-25 DIAGNOSIS — J45909 Unspecified asthma, uncomplicated: Secondary | ICD-10-CM | POA: Diagnosis not present

## 2021-09-25 DIAGNOSIS — F3289 Other specified depressive episodes: Secondary | ICD-10-CM | POA: Diagnosis not present

## 2021-09-25 DIAGNOSIS — R21 Rash and other nonspecific skin eruption: Secondary | ICD-10-CM | POA: Diagnosis not present

## 2021-09-25 DIAGNOSIS — R110 Nausea: Secondary | ICD-10-CM | POA: Diagnosis not present

## 2021-09-25 DIAGNOSIS — J849 Interstitial pulmonary disease, unspecified: Secondary | ICD-10-CM | POA: Diagnosis not present

## 2021-09-25 DIAGNOSIS — G893 Neoplasm related pain (acute) (chronic): Secondary | ICD-10-CM | POA: Diagnosis not present

## 2021-09-26 ENCOUNTER — Telehealth: Payer: Self-pay

## 2021-09-26 NOTE — Telephone Encounter (Signed)
Volunteer check in call for palliative care, no answer 

## 2021-10-08 DIAGNOSIS — G35 Multiple sclerosis: Secondary | ICD-10-CM | POA: Diagnosis not present

## 2021-10-08 DIAGNOSIS — C3490 Malignant neoplasm of unspecified part of unspecified bronchus or lung: Secondary | ICD-10-CM | POA: Diagnosis not present

## 2021-10-08 DIAGNOSIS — E785 Hyperlipidemia, unspecified: Secondary | ICD-10-CM | POA: Diagnosis not present

## 2021-10-08 DIAGNOSIS — C760 Malignant neoplasm of head, face and neck: Secondary | ICD-10-CM | POA: Diagnosis not present

## 2021-10-16 DIAGNOSIS — C300 Malignant neoplasm of nasal cavity: Secondary | ICD-10-CM | POA: Diagnosis not present

## 2021-10-16 DIAGNOSIS — Z5112 Encounter for antineoplastic immunotherapy: Secondary | ICD-10-CM | POA: Diagnosis not present

## 2021-10-16 DIAGNOSIS — Z95828 Presence of other vascular implants and grafts: Secondary | ICD-10-CM | POA: Diagnosis not present

## 2021-10-17 DIAGNOSIS — F322 Major depressive disorder, single episode, severe without psychotic features: Secondary | ICD-10-CM | POA: Diagnosis not present

## 2021-10-17 DIAGNOSIS — F4321 Adjustment disorder with depressed mood: Secondary | ICD-10-CM | POA: Diagnosis not present

## 2021-10-17 DIAGNOSIS — Z79899 Other long term (current) drug therapy: Secondary | ICD-10-CM | POA: Diagnosis not present

## 2021-10-17 DIAGNOSIS — F439 Reaction to severe stress, unspecified: Secondary | ICD-10-CM | POA: Diagnosis not present

## 2021-11-06 DIAGNOSIS — R21 Rash and other nonspecific skin eruption: Secondary | ICD-10-CM | POA: Diagnosis not present

## 2021-11-06 DIAGNOSIS — L299 Pruritus, unspecified: Secondary | ICD-10-CM | POA: Diagnosis not present

## 2021-11-06 DIAGNOSIS — C7801 Secondary malignant neoplasm of right lung: Secondary | ICD-10-CM | POA: Diagnosis not present

## 2021-11-06 DIAGNOSIS — J45909 Unspecified asthma, uncomplicated: Secondary | ICD-10-CM | POA: Diagnosis not present

## 2021-11-06 DIAGNOSIS — C300 Malignant neoplasm of nasal cavity: Secondary | ICD-10-CM | POA: Diagnosis not present

## 2021-11-06 DIAGNOSIS — F3289 Other specified depressive episodes: Secondary | ICD-10-CM | POA: Diagnosis not present

## 2021-11-06 DIAGNOSIS — F0631 Mood disorder due to known physiological condition with depressive features: Secondary | ICD-10-CM | POA: Diagnosis not present

## 2021-11-06 DIAGNOSIS — R918 Other nonspecific abnormal finding of lung field: Secondary | ICD-10-CM | POA: Diagnosis not present

## 2021-11-06 DIAGNOSIS — M19011 Primary osteoarthritis, right shoulder: Secondary | ICD-10-CM | POA: Diagnosis not present

## 2021-11-06 DIAGNOSIS — J849 Interstitial pulmonary disease, unspecified: Secondary | ICD-10-CM | POA: Diagnosis not present

## 2021-11-06 DIAGNOSIS — R110 Nausea: Secondary | ICD-10-CM | POA: Diagnosis not present

## 2021-11-06 DIAGNOSIS — G893 Neoplasm related pain (acute) (chronic): Secondary | ICD-10-CM | POA: Diagnosis not present

## 2021-11-06 DIAGNOSIS — M1990 Unspecified osteoarthritis, unspecified site: Secondary | ICD-10-CM | POA: Diagnosis not present

## 2021-11-07 DIAGNOSIS — E785 Hyperlipidemia, unspecified: Secondary | ICD-10-CM | POA: Diagnosis not present

## 2021-11-07 DIAGNOSIS — F330 Major depressive disorder, recurrent, mild: Secondary | ICD-10-CM | POA: Diagnosis not present

## 2021-11-07 DIAGNOSIS — E291 Testicular hypofunction: Secondary | ICD-10-CM | POA: Diagnosis not present

## 2021-11-10 DIAGNOSIS — J45909 Unspecified asthma, uncomplicated: Secondary | ICD-10-CM | POA: Diagnosis not present

## 2021-11-10 DIAGNOSIS — J45901 Unspecified asthma with (acute) exacerbation: Secondary | ICD-10-CM | POA: Diagnosis not present

## 2021-11-10 DIAGNOSIS — C760 Malignant neoplasm of head, face and neck: Secondary | ICD-10-CM | POA: Diagnosis not present

## 2021-11-27 DIAGNOSIS — Z5112 Encounter for antineoplastic immunotherapy: Secondary | ICD-10-CM | POA: Diagnosis not present

## 2021-11-27 DIAGNOSIS — C7801 Secondary malignant neoplasm of right lung: Secondary | ICD-10-CM | POA: Diagnosis not present

## 2021-11-27 DIAGNOSIS — Z95828 Presence of other vascular implants and grafts: Secondary | ICD-10-CM | POA: Diagnosis not present

## 2021-11-27 DIAGNOSIS — C300 Malignant neoplasm of nasal cavity: Secondary | ICD-10-CM | POA: Diagnosis not present

## 2021-12-08 DIAGNOSIS — F322 Major depressive disorder, single episode, severe without psychotic features: Secondary | ICD-10-CM | POA: Diagnosis not present

## 2021-12-08 DIAGNOSIS — Z79899 Other long term (current) drug therapy: Secondary | ICD-10-CM | POA: Diagnosis not present

## 2021-12-08 DIAGNOSIS — F4321 Adjustment disorder with depressed mood: Secondary | ICD-10-CM | POA: Diagnosis not present

## 2021-12-08 DIAGNOSIS — F439 Reaction to severe stress, unspecified: Secondary | ICD-10-CM | POA: Diagnosis not present

## 2021-12-12 DIAGNOSIS — C300 Malignant neoplasm of nasal cavity: Secondary | ICD-10-CM | POA: Diagnosis not present

## 2021-12-16 DIAGNOSIS — E785 Hyperlipidemia, unspecified: Secondary | ICD-10-CM | POA: Diagnosis not present

## 2021-12-16 DIAGNOSIS — R7989 Other specified abnormal findings of blood chemistry: Secondary | ICD-10-CM | POA: Diagnosis not present

## 2021-12-16 DIAGNOSIS — Z125 Encounter for screening for malignant neoplasm of prostate: Secondary | ICD-10-CM | POA: Diagnosis not present

## 2021-12-16 DIAGNOSIS — R001 Bradycardia, unspecified: Secondary | ICD-10-CM | POA: Diagnosis not present

## 2021-12-16 DIAGNOSIS — F419 Anxiety disorder, unspecified: Secondary | ICD-10-CM | POA: Diagnosis not present

## 2021-12-18 DIAGNOSIS — C300 Malignant neoplasm of nasal cavity: Secondary | ICD-10-CM | POA: Diagnosis not present

## 2021-12-18 DIAGNOSIS — M1380 Other specified arthritis, unspecified site: Secondary | ICD-10-CM | POA: Diagnosis not present

## 2021-12-18 DIAGNOSIS — M1990 Unspecified osteoarthritis, unspecified site: Secondary | ICD-10-CM | POA: Diagnosis not present

## 2021-12-18 DIAGNOSIS — J849 Interstitial pulmonary disease, unspecified: Secondary | ICD-10-CM | POA: Diagnosis not present

## 2021-12-18 DIAGNOSIS — R21 Rash and other nonspecific skin eruption: Secondary | ICD-10-CM | POA: Diagnosis not present

## 2021-12-18 DIAGNOSIS — F32A Depression, unspecified: Secondary | ICD-10-CM | POA: Diagnosis not present

## 2021-12-18 DIAGNOSIS — J45909 Unspecified asthma, uncomplicated: Secondary | ICD-10-CM | POA: Diagnosis not present

## 2021-12-18 DIAGNOSIS — Z5112 Encounter for antineoplastic immunotherapy: Secondary | ICD-10-CM | POA: Diagnosis not present

## 2021-12-18 DIAGNOSIS — F0631 Mood disorder due to known physiological condition with depressive features: Secondary | ICD-10-CM | POA: Diagnosis not present

## 2021-12-18 DIAGNOSIS — G893 Neoplasm related pain (acute) (chronic): Secondary | ICD-10-CM | POA: Diagnosis not present

## 2021-12-18 DIAGNOSIS — Z902 Acquired absence of lung [part of]: Secondary | ICD-10-CM | POA: Diagnosis not present

## 2021-12-18 DIAGNOSIS — R110 Nausea: Secondary | ICD-10-CM | POA: Diagnosis not present

## 2021-12-18 DIAGNOSIS — M950 Acquired deformity of nose: Secondary | ICD-10-CM | POA: Diagnosis not present

## 2021-12-18 DIAGNOSIS — L299 Pruritus, unspecified: Secondary | ICD-10-CM | POA: Diagnosis not present

## 2021-12-18 DIAGNOSIS — R918 Other nonspecific abnormal finding of lung field: Secondary | ICD-10-CM | POA: Diagnosis not present

## 2021-12-18 DIAGNOSIS — C7801 Secondary malignant neoplasm of right lung: Secondary | ICD-10-CM | POA: Diagnosis not present

## 2021-12-24 DIAGNOSIS — F330 Major depressive disorder, recurrent, mild: Secondary | ICD-10-CM | POA: Diagnosis not present

## 2021-12-24 DIAGNOSIS — G4700 Insomnia, unspecified: Secondary | ICD-10-CM | POA: Diagnosis not present

## 2021-12-24 DIAGNOSIS — C760 Malignant neoplasm of head, face and neck: Secondary | ICD-10-CM | POA: Diagnosis not present

## 2021-12-24 DIAGNOSIS — J45909 Unspecified asthma, uncomplicated: Secondary | ICD-10-CM | POA: Diagnosis not present

## 2021-12-24 DIAGNOSIS — E785 Hyperlipidemia, unspecified: Secondary | ICD-10-CM | POA: Diagnosis not present

## 2021-12-24 DIAGNOSIS — E291 Testicular hypofunction: Secondary | ICD-10-CM | POA: Diagnosis not present

## 2021-12-24 DIAGNOSIS — Z0000 Encounter for general adult medical examination without abnormal findings: Secondary | ICD-10-CM | POA: Diagnosis not present

## 2021-12-24 DIAGNOSIS — F419 Anxiety disorder, unspecified: Secondary | ICD-10-CM | POA: Diagnosis not present

## 2022-01-08 DIAGNOSIS — Z5112 Encounter for antineoplastic immunotherapy: Secondary | ICD-10-CM | POA: Diagnosis not present

## 2022-01-08 DIAGNOSIS — C300 Malignant neoplasm of nasal cavity: Secondary | ICD-10-CM | POA: Diagnosis not present

## 2022-01-26 DIAGNOSIS — F4321 Adjustment disorder with depressed mood: Secondary | ICD-10-CM | POA: Diagnosis not present

## 2022-01-26 DIAGNOSIS — F322 Major depressive disorder, single episode, severe without psychotic features: Secondary | ICD-10-CM | POA: Diagnosis not present

## 2022-01-26 DIAGNOSIS — Z79899 Other long term (current) drug therapy: Secondary | ICD-10-CM | POA: Diagnosis not present

## 2022-01-26 DIAGNOSIS — F439 Reaction to severe stress, unspecified: Secondary | ICD-10-CM | POA: Diagnosis not present

## 2022-01-29 DIAGNOSIS — Z95828 Presence of other vascular implants and grafts: Secondary | ICD-10-CM | POA: Diagnosis not present

## 2022-01-29 DIAGNOSIS — Z5112 Encounter for antineoplastic immunotherapy: Secondary | ICD-10-CM | POA: Diagnosis not present

## 2022-01-29 DIAGNOSIS — Z79899 Other long term (current) drug therapy: Secondary | ICD-10-CM | POA: Diagnosis not present

## 2022-01-29 DIAGNOSIS — C300 Malignant neoplasm of nasal cavity: Secondary | ICD-10-CM | POA: Diagnosis not present

## 2022-02-05 ENCOUNTER — Encounter: Payer: Self-pay | Admitting: Gastroenterology

## 2022-02-11 DIAGNOSIS — L308 Other specified dermatitis: Secondary | ICD-10-CM | POA: Diagnosis not present

## 2022-02-11 DIAGNOSIS — D489 Neoplasm of uncertain behavior, unspecified: Secondary | ICD-10-CM | POA: Diagnosis not present

## 2022-02-11 DIAGNOSIS — D225 Melanocytic nevi of trunk: Secondary | ICD-10-CM | POA: Diagnosis not present

## 2022-02-19 DIAGNOSIS — R110 Nausea: Secondary | ICD-10-CM | POA: Diagnosis not present

## 2022-02-19 DIAGNOSIS — L299 Pruritus, unspecified: Secondary | ICD-10-CM | POA: Diagnosis not present

## 2022-02-19 DIAGNOSIS — R519 Headache, unspecified: Secondary | ICD-10-CM | POA: Diagnosis not present

## 2022-02-19 DIAGNOSIS — R109 Unspecified abdominal pain: Secondary | ICD-10-CM | POA: Diagnosis not present

## 2022-02-19 DIAGNOSIS — Z5112 Encounter for antineoplastic immunotherapy: Secondary | ICD-10-CM | POA: Diagnosis not present

## 2022-02-19 DIAGNOSIS — G893 Neoplasm related pain (acute) (chronic): Secondary | ICD-10-CM | POA: Diagnosis not present

## 2022-02-19 DIAGNOSIS — J45909 Unspecified asthma, uncomplicated: Secondary | ICD-10-CM | POA: Diagnosis not present

## 2022-02-19 DIAGNOSIS — J849 Interstitial pulmonary disease, unspecified: Secondary | ICD-10-CM | POA: Diagnosis not present

## 2022-02-19 DIAGNOSIS — M2550 Pain in unspecified joint: Secondary | ICD-10-CM | POA: Diagnosis not present

## 2022-02-19 DIAGNOSIS — F32A Depression, unspecified: Secondary | ICD-10-CM | POA: Diagnosis not present

## 2022-02-19 DIAGNOSIS — C300 Malignant neoplasm of nasal cavity: Secondary | ICD-10-CM | POA: Diagnosis not present

## 2022-02-19 DIAGNOSIS — C7801 Secondary malignant neoplasm of right lung: Secondary | ICD-10-CM | POA: Diagnosis not present

## 2022-03-02 DIAGNOSIS — Z8522 Personal history of malignant neoplasm of nasal cavities, middle ear, and accessory sinuses: Secondary | ICD-10-CM | POA: Diagnosis not present

## 2022-03-02 DIAGNOSIS — Z08 Encounter for follow-up examination after completed treatment for malignant neoplasm: Secondary | ICD-10-CM | POA: Diagnosis not present

## 2022-03-02 DIAGNOSIS — C300 Malignant neoplasm of nasal cavity: Secondary | ICD-10-CM | POA: Diagnosis not present

## 2022-03-03 ENCOUNTER — Ambulatory Visit (AMBULATORY_SURGERY_CENTER): Payer: Self-pay

## 2022-03-03 VITALS — Ht 69.0 in | Wt 170.0 lb

## 2022-03-03 DIAGNOSIS — Z1211 Encounter for screening for malignant neoplasm of colon: Secondary | ICD-10-CM

## 2022-03-03 MED ORDER — NA SULFATE-K SULFATE-MG SULF 17.5-3.13-1.6 GM/177ML PO SOLN: 1.0000 | Freq: Once | ORAL | 0 refills | Status: AC

## 2022-03-03 NOTE — Progress Notes (Signed)

## 2022-03-12 DIAGNOSIS — C300 Malignant neoplasm of nasal cavity: Secondary | ICD-10-CM | POA: Diagnosis not present

## 2022-03-12 DIAGNOSIS — Z79899 Other long term (current) drug therapy: Secondary | ICD-10-CM | POA: Diagnosis not present

## 2022-03-12 DIAGNOSIS — Z5112 Encounter for antineoplastic immunotherapy: Secondary | ICD-10-CM | POA: Diagnosis not present

## 2022-03-12 DIAGNOSIS — Z95828 Presence of other vascular implants and grafts: Secondary | ICD-10-CM | POA: Diagnosis not present

## 2022-03-13 ENCOUNTER — Encounter: Payer: Self-pay | Admitting: Gastroenterology

## 2022-03-15 ENCOUNTER — Encounter: Payer: Self-pay | Admitting: Certified Registered Nurse Anesthetist

## 2022-03-23 ENCOUNTER — Encounter: Payer: PRIVATE HEALTH INSURANCE | Admitting: Gastroenterology

## 2022-03-23 ENCOUNTER — Telehealth: Payer: Self-pay | Admitting: Gastroenterology

## 2022-03-23 NOTE — Telephone Encounter (Signed)
fyi

## 2022-03-23 NOTE — Telephone Encounter (Signed)
Okay thank you for letting me know

## 2022-03-23 NOTE — Telephone Encounter (Signed)
Inbound call from patient wife stating patient forgot to take prep instructions due to his mo passing. Patient rescheduled for after January.

## 2022-04-09 DIAGNOSIS — C300 Malignant neoplasm of nasal cavity: Secondary | ICD-10-CM | POA: Diagnosis not present

## 2022-04-09 DIAGNOSIS — Z5112 Encounter for antineoplastic immunotherapy: Secondary | ICD-10-CM | POA: Diagnosis not present

## 2022-04-09 DIAGNOSIS — Z95828 Presence of other vascular implants and grafts: Secondary | ICD-10-CM | POA: Diagnosis not present

## 2022-04-09 DIAGNOSIS — Z79899 Other long term (current) drug therapy: Secondary | ICD-10-CM | POA: Diagnosis not present

## 2022-04-27 DIAGNOSIS — L308 Other specified dermatitis: Secondary | ICD-10-CM | POA: Diagnosis not present

## 2022-04-29 ENCOUNTER — Ambulatory Visit (AMBULATORY_SURGERY_CENTER): Payer: Medicare HMO

## 2022-04-29 VITALS — Ht 69.0 in | Wt 168.5 lb

## 2022-04-29 DIAGNOSIS — Z1211 Encounter for screening for malignant neoplasm of colon: Secondary | ICD-10-CM

## 2022-04-29 NOTE — Progress Notes (Signed)
No egg or soy allergy known to patient  No issues known to pt with past sedation with any surgeries or procedures Patient denies ever being told they had issues or difficulty with intubation  No FH of Malignant Hyperthermia Pt is not on diet pills Pt is not on  home 02  Pt is not on blood thinners  Pt denies reports miralax use d/t prescribed pain meds.  Encouraged patient to take 17g daily for a week leading up to procedure. No A fib or A flutter  Patient already has Suprep on hand from previously cancelled procedure.  Instructions updated and explained to patient.    Marland Kitchen

## 2022-04-30 DIAGNOSIS — F0631 Mood disorder due to known physiological condition with depressive features: Secondary | ICD-10-CM | POA: Diagnosis not present

## 2022-04-30 DIAGNOSIS — F3289 Other specified depressive episodes: Secondary | ICD-10-CM | POA: Diagnosis not present

## 2022-04-30 DIAGNOSIS — C300 Malignant neoplasm of nasal cavity: Secondary | ICD-10-CM | POA: Diagnosis not present

## 2022-04-30 DIAGNOSIS — C7801 Secondary malignant neoplasm of right lung: Secondary | ICD-10-CM | POA: Diagnosis not present

## 2022-04-30 DIAGNOSIS — R21 Rash and other nonspecific skin eruption: Secondary | ICD-10-CM | POA: Diagnosis not present

## 2022-04-30 DIAGNOSIS — M2550 Pain in unspecified joint: Secondary | ICD-10-CM | POA: Diagnosis not present

## 2022-04-30 DIAGNOSIS — M1990 Unspecified osteoarthritis, unspecified site: Secondary | ICD-10-CM | POA: Diagnosis not present

## 2022-04-30 DIAGNOSIS — J45909 Unspecified asthma, uncomplicated: Secondary | ICD-10-CM | POA: Diagnosis not present

## 2022-04-30 DIAGNOSIS — G893 Neoplasm related pain (acute) (chronic): Secondary | ICD-10-CM | POA: Diagnosis not present

## 2022-04-30 DIAGNOSIS — L299 Pruritus, unspecified: Secondary | ICD-10-CM | POA: Diagnosis not present

## 2022-04-30 DIAGNOSIS — J849 Interstitial pulmonary disease, unspecified: Secondary | ICD-10-CM | POA: Diagnosis not present

## 2022-04-30 DIAGNOSIS — Z5112 Encounter for antineoplastic immunotherapy: Secondary | ICD-10-CM | POA: Diagnosis not present

## 2022-04-30 DIAGNOSIS — R110 Nausea: Secondary | ICD-10-CM | POA: Diagnosis not present

## 2022-05-13 ENCOUNTER — Encounter: Payer: PRIVATE HEALTH INSURANCE | Admitting: Gastroenterology

## 2022-05-21 DIAGNOSIS — C300 Malignant neoplasm of nasal cavity: Secondary | ICD-10-CM | POA: Diagnosis not present

## 2022-05-21 DIAGNOSIS — C7801 Secondary malignant neoplasm of right lung: Secondary | ICD-10-CM | POA: Diagnosis not present

## 2022-05-21 DIAGNOSIS — Z87891 Personal history of nicotine dependence: Secondary | ICD-10-CM | POA: Diagnosis not present

## 2022-05-21 DIAGNOSIS — Z5112 Encounter for antineoplastic immunotherapy: Secondary | ICD-10-CM | POA: Diagnosis not present

## 2022-06-01 ENCOUNTER — Encounter: Payer: Self-pay | Admitting: Gastroenterology

## 2022-06-03 ENCOUNTER — Ambulatory Visit: Payer: Medicare HMO | Admitting: Gastroenterology

## 2022-06-03 ENCOUNTER — Encounter: Payer: Self-pay | Admitting: Gastroenterology

## 2022-06-03 VITALS — BP 118/73 | HR 61 | Temp 96.4°F | Resp 12 | Ht 69.0 in | Wt 168.5 lb

## 2022-06-03 DIAGNOSIS — D123 Benign neoplasm of transverse colon: Secondary | ICD-10-CM

## 2022-06-03 DIAGNOSIS — Z1211 Encounter for screening for malignant neoplasm of colon: Secondary | ICD-10-CM | POA: Diagnosis not present

## 2022-06-03 DIAGNOSIS — J45909 Unspecified asthma, uncomplicated: Secondary | ICD-10-CM | POA: Diagnosis not present

## 2022-06-03 DIAGNOSIS — F32A Depression, unspecified: Secondary | ICD-10-CM | POA: Diagnosis not present

## 2022-06-03 DIAGNOSIS — F419 Anxiety disorder, unspecified: Secondary | ICD-10-CM | POA: Diagnosis not present

## 2022-06-03 DIAGNOSIS — D122 Benign neoplasm of ascending colon: Secondary | ICD-10-CM

## 2022-06-03 DIAGNOSIS — K635 Polyp of colon: Secondary | ICD-10-CM | POA: Diagnosis not present

## 2022-06-03 MED ORDER — SODIUM CHLORIDE 0.9 % IV SOLN: 500.0000 mL | INTRAVENOUS | Status: DC

## 2022-06-03 NOTE — Patient Instructions (Signed)
Await pathology results.  Handout on polyps provided.  YOU HAD AN ENDOSCOPIC PROCEDURE TODAY AT [facility]:   Refer to the procedure report that was given to you for any specific questions about what was found during the examination.  If the procedure report does not answer your questions, please call your gastroenterologist to clarify.  If you requested that your care partner not be given the details of your procedure findings, then the procedure report has been included in a sealed envelope for you to review at your convenience later.  YOU SHOULD EXPECT: Some feelings of bloating in the abdomen. Passage of more gas than usual.  Walking can help get rid of the air that was put into your GI tract during the procedure and reduce the bloating. If you had a lower endoscopy (such as a colonoscopy or flexible sigmoidoscopy) you may notice spotting of blood in your stool or on the toilet paper. If you underwent a bowel prep for your procedure, you may not have a normal bowel movement for a few days.  Please Note:  You might notice some irritation and congestion in your nose or some drainage.  This is from the oxygen used during your procedure.  There is no need for concern and it should clear up in a day or so.  SYMPTOMS TO REPORT IMMEDIATELY:  Following lower endoscopy (colonoscopy or flexible sigmoidoscopy):  Excessive amounts of blood in the stool  Significant tenderness or worsening of abdominal pains  Swelling of the abdomen that is new, acute  Fever of 100F or higher   For urgent or emergent issues, a gastroenterologist can be reached at any hour by calling [phone]. Do not use MyChart messaging for urgent concerns.    DIET:  We do recommend a small meal at first, but then you may proceed to your regular diet.  Drink plenty of fluids but you should avoid alcoholic beverages for 24 hours.  ACTIVITY:  You should plan to take it easy for the rest of today and you should  NOT DRIVE or use heavy machinery until tomorrow (because of the sedation medicines used during the test).    FOLLOW UP: Our staff will call the number listed on your records the next business day following your procedure.  We will call around 7:15- 8:00 am to check on you and address any questions or concerns that you may have regarding the information given to you following your procedure. If we do not reach you, we will leave a message.     If any biopsies were taken you will be contacted by phone or by letter within the next 1-3 weeks.  Please call us at [phone] if you have not heard about the biopsies in 3 weeks.    SIGNATURES/CONFIDENTIALITY: You and/or your care partner have signed paperwork which will be entered into your electronic medical record.  These signatures attest to the fact that that the information above on your After Visit Summary has been reviewed and is understood.  Full responsibility of the confidentiality of this discharge information lies with you and/or your care-partner.

## 2022-06-03 NOTE — Op Note (Signed)
Micheal Faulkner Patient Name: Micheal Faulkner Procedure Date: 06/03/2022 9:44 AM MRN: [id number] Endoscopist: Remo Lipps P. Havery Moros , MD, [id number] Age: 52 Referring MD:  Date of Birth: 1970/12/18 Gender: Male Account #: [id number] Procedure:                Colonoscopy Indications:              Screening for colorectal malignant neoplasm, This                            is the patient's first colonoscopy Medicines:                Monitored Anesthesia Care Procedure:                Pre-Anesthesia Assessment:                           - Prior to the procedure, a History and Physical                            was performed, and patient medications and                            allergies were reviewed. The patient's tolerance of                            previous anesthesia was also reviewed. The risks                            and benefits of the procedure and the sedation                            options and risks were discussed with the patient.                            All questions were answered, and informed consent                            was obtained. Prior Anticoagulants: The patient has                            taken no anticoagulant or antiplatelet agents. ASA                            Grade Assessment: II - A patient with mild systemic                            disease. After reviewing the risks and benefits,                            the patient was deemed in satisfactory condition to                            undergo the procedure.  After obtaining informed consent, the colonoscope                            was passed under direct vision. Throughout the                            procedure, the patient's blood pressure, pulse, and                            oxygen saturations were monitored continuously. The                            Olympus CF-HQ190L (Serial# [id number]) Colonoscope was                            introduced through  the anus and advanced to the the                            cecum, identified by appendiceal orifice and                            ileocecal valve. The colonoscopy was performed                            without difficulty. The patient tolerated the                            procedure well. The quality of the bowel                            preparation was good. The ileocecal valve,                            appendiceal orifice, and rectum were photographed. Scope In: 9:50:40 AM Scope Out: 10:10:17 AM Scope Withdrawal Time: 0 hours 17 minutes 33 seconds  Total Procedure Duration: 0 hours 19 minutes 37 seconds  Findings:                 The perianal and digital rectal examinations were                            normal.                           Two sessile polyps were found in the ascending                            colon. The polyps were 4 to 8 mm in size. These                            polyps were removed with a cold snare. Resection                            and retrieval were complete.  Two sessile polyps were found in the transverse                            colon. The polyps were 3 to 5 mm in size. These                            polyps were removed with a cold snare. Resection                            and retrieval were complete.                           Internal hemorrhoids were found during                            retroflexion. The hemorrhoids were small.                           The exam was otherwise without abnormality. Complications:            No immediate complications. Estimated blood loss:                            Minimal. Estimated Blood Loss:     Estimated blood loss was minimal. Impression:               - Two 4 to 8 mm polyps in the ascending colon,                            removed with a cold snare. Resected and retrieved.                           - Two 3 to 5 mm polyps in the transverse colon,                             removed with a cold snare. Resected and retrieved.                           - Internal hemorrhoids.                           - The examination was otherwise normal. Recommendation:           - Patient has a contact number available for                            emergencies. The signs and symptoms of potential                            delayed complications were discussed with the                            patient. Return to normal activities tomorrow.  Written discharge instructions were provided to the                            patient.                           - Resume previous diet.                           - Continue present medications.                           - Await pathology results. Remo Lipps P. Octivia Canion, MD 06/03/2022 10:14:38 AM This report has been signed electronically.

## 2022-06-03 NOTE — Progress Notes (Signed)
[facility] History and Physical   Primary Care Physician:  Micheal Hatchet, MD   Reason for Procedure:   Colon cancer screening  Plan:    colonoscopy     HPI: Micheal Faulkner is a 52 y.o. male  here for colonoscopy screening - first time exam.    Patient denies any bowel symptoms at this time. No family history of colon cancer known. Otherwise feels well without any cardiopulmonary symptoms.   I have discussed risks / benefits of anesthesia and endoscopic procedure with Micheal Faulkner and they wish to proceed with the exams as outlined today.    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    RHINECTOMY  01/19/2017   Rhinectomy partial, surgeon, Micheal Conception MD at [facility].    rib graft harvest  02/11/2017   rotation forehead flap  02/11/2017   VASECTOMY      Prior to Admission medications   Medication Sig Start Date End Date Taking? Authorizing Provider  ALPRAZolam Micheal Faulkner) 0.5 MG tablet Take 0.5 mg by mouth. 02/10/17  Yes [provider]  amphetamine-dextroamphetamine (ADDERALL) 15 MG tablet Take 1-1.5 tablets by mouth daily. 01/27/22  Yes [provider]  ARIPiprazole (ABILIFY) 5 MG tablet Take 5 mg by mouth daily. 02/22/22  Yes [provider]  budesonide-formoterol (SYMBICORT) 160-4.5 MCG/ACT inhaler Inhale 2 puffs into the lungs 2 (two) times daily.   Yes [provider]  FLUoxetine (PROZAC) 20 MG capsule Take 60 mg by  mouth daily. 03/18/22  Yes [provider]  gabapentin (NEURONTIN) 600 MG tablet Take 600 mg by mouth 4 (four) times daily. 02/19/22  Yes [provider]  Na Sulfate-K Sulfate-Mg Sulf 17.5-3.13-1.6 GM/177ML SOLN Take 1 kit by mouth as directed.   Yes [provider]  pembrolizumab (KEYTRUDA) 100 MG/4ML SOLN See admin instructions.   Yes [provider]  rosuvastatin (CRESTOR) 20 MG tablet TAKE 1 TABLET BY MOUTH EVERY EVENING AFTER A MEAL 03/08/17  Yes [provider]  sodium fluoride (FLUORISHIELD) 1.1 % GEL dental gel Instill 1 drop of gel per tooth space of fluoride tray.  Place over teeth for 5 minutes.  Remove.  Spit out excess.  Repeat nightly. 03/31/17  Yes Micheal Faulkner, DDS  sodium fluoride (PREVIDENT 5000 PLUS) 1.1 % CREA dental cream Apply to tooth brush. Brush teeth for 2 minutes. Spit out excess. Repeat nightly. 06/30/17  Yes Micheal Faulkner, DDS  Testosterone 25 MG/2.5GM (1%) GEL Apply 1 packet topically daily.   Yes [provider]  traZODone (DESYREL) 50 MG tablet TAKE 1 TABLET BY MOUTH EVERY EVENING AT BEDTIME AS NEEDED FOR SLEEP 03/27/17  Yes [provider]  zolpidem (AMBIEN) 10 MG tablet TAKE 1/2 TABLET BY MOUTH AT BEDTIME AS NEEDED FOR SLEEP 03/09/17  Yes [provider]  acetaminophen (TYLENOL) 500 MG tablet Take  1,000 mg by mouth. 01/21/17   [provider]  albuterol (ACCUNEB) 0.63 MG/3ML nebulizer solution Inhale into the lungs. 11/28/19   [provider]  aspirin 325 MG tablet Take 325 mg by mouth. 02/16/17   [provider]  diclofenac (VOLTAREN) 75 MG EC tablet Take 1 tablet (75 mg total) by mouth 2 (two) times daily. 03/11/13   Harden Mo, MD  escitalopram (LEXAPRO) 10 MG tablet TAKE 1 TABLET BY MOUTH ONCE DAILY IN THE MORNING Patient not taking: Reported on 03/03/2022 02/28/17   [provider]  HYDROcodone-acetaminophen (NORCO/VICODIN) 5-325 MG tablet Take 1  tablet by mouth every 6 (six) hours as needed for moderate pain. 05/10/17   Gery Pray, MD  ibuprofen (ADVIL,MOTRIN) 200 MG tablet Take 200 mg by mouth.    [provider]  lidocaine (XYLOCAINE) 2 % solution Patient: Mix 1part 2% viscous lidocaine, 1part H20. Swish+spit 58mL of diluted mixture, 8xs a day, PRN soreness. May apply to nostrils PRN. Patient not taking: Reported on 03/03/2022 04/26/17   Eppie Gibson, MD  Multiple Vitamins-Minerals (MULTIVITAMIN WITH MINERALS) tablet Take 1 tablet by mouth daily.    [provider]  ondansetron (ZOFRAN-ODT) 8 MG disintegrating tablet Take by mouth. 10/12/19   [provider]  oxyCODONE (OXY IR/ROXICODONE) 5 MG immediate release tablet Take 5-10 mg by mouth every 6 (six) hours as needed. 10/22/21   [provider]  polyethylene glycol (MIRALAX / GLYCOLAX) 17 g packet Take by mouth.    [provider]  tadalafil (CIALIS) 10 MG tablet Take 10 mg by mouth.    [provider]  triamcinolone cream (KENALOG) 0.1 % Apply 1 Application topically daily. 04/27/22   [provider]  V-R NASAL SPRAY SALINE 0.65 % nasal spray  03/13/17   [provider]    Current Outpatient Medications  Medication Sig Dispense Refill   ALPRAZolam (XANAX) 0.5 MG tablet Take 0.5 mg by mouth.     amphetamine-dextroamphetamine (ADDERALL) 15 MG tablet Take 1-1.5 tablets by mouth daily.     ARIPiprazole (ABILIFY) 5 MG tablet Take 5 mg by mouth daily.     budesonide-formoterol (SYMBICORT) 160-4.5 MCG/ACT inhaler Inhale 2 puffs into the lungs 2 (two) times daily.     FLUoxetine (PROZAC) 20 MG capsule Take 60 mg by mouth daily.     gabapentin (NEURONTIN) 600 MG tablet Take 600 mg by mouth 4 (four) times daily.     Na Sulfate-K Sulfate-Mg Sulf 17.5-3.13-1.6 GM/177ML SOLN Take 1 kit by mouth as directed.     pembrolizumab (KEYTRUDA) 100 MG/4ML SOLN See admin instructions.     rosuvastatin (CRESTOR) 20 MG tablet TAKE 1  TABLET BY MOUTH EVERY EVENING AFTER A MEAL  1   sodium fluoride (FLUORISHIELD) 1.1 % GEL dental gel Instill 1 drop of gel per tooth space of fluoride tray.  Place over teeth for 5 minutes.  Remove.  Spit out excess.  Repeat nightly. 120 mL prn   sodium fluoride (PREVIDENT 5000 PLUS) 1.1 % CREA dental cream Apply to tooth brush. Brush teeth for 2 minutes. Spit out excess. Repeat nightly. 1 Tube prn   Testosterone 25 MG/2.5GM (1%) GEL Apply 1 packet topically daily.     traZODone (DESYREL) 50 MG tablet TAKE 1 TABLET BY MOUTH EVERY EVENING AT BEDTIME AS NEEDED FOR SLEEP  3   zolpidem (AMBIEN) 10 MG tablet TAKE 1/2 TABLET BY MOUTH AT BEDTIME AS NEEDED FOR SLEEP  2   acetaminophen (TYLENOL) 500 MG tablet  Take 1,000 mg by mouth.     albuterol (ACCUNEB) 0.63 MG/3ML nebulizer solution Inhale into the lungs.     aspirin 325 MG tablet Take 325 mg by mouth.     diclofenac (VOLTAREN) 75 MG EC tablet Take 1 tablet (75 mg total) by mouth 2 (two) times daily. 30 tablet 0   escitalopram (LEXAPRO) 10 MG tablet TAKE 1 TABLET BY MOUTH ONCE DAILY IN THE MORNING (Patient not taking: Reported on 03/03/2022)  1   HYDROcodone-acetaminophen (NORCO/VICODIN) 5-325 MG tablet Take 1 tablet by mouth every 6 (six) hours as needed for moderate pain. 30 tablet 0   ibuprofen (ADVIL,MOTRIN) 200 MG tablet Take 200 mg by mouth.     lidocaine (XYLOCAINE) 2 % solution Patient: Mix 1part 2% viscous lidocaine, 1part H20. Swish+spit 59mL of diluted mixture, 8xs a day, PRN soreness. May apply to nostrils PRN. (Patient not taking: Reported on 03/03/2022) 100 mL 5   Multiple Vitamins-Minerals (MULTIVITAMIN WITH MINERALS) tablet Take 1 tablet by mouth daily.     ondansetron (ZOFRAN-ODT) 8 MG disintegrating tablet Take by mouth.     oxyCODONE (OXY IR/ROXICODONE) 5 MG immediate release tablet Take 5-10 mg by mouth every 6 (six) hours as needed.     polyethylene glycol (MIRALAX / GLYCOLAX) 17 g packet Take by mouth.     tadalafil (CIALIS) 10 MG  tablet Take 10 mg by mouth.     triamcinolone cream (KENALOG) 0.1 % Apply 1 Application topically daily.     V-R NASAL SPRAY SALINE 0.65 % nasal spray   2   Current Facility-Administered Medications  Medication Dose Route Frequency Provider Last Rate Last Admin   0.9 %  sodium chloride infusion  500 mL Intravenous Continuous Chrishawn Boley, Carlota Raspberry, MD        Allergies as of 06/03/2022 - Review Complete 06/03/2022  Allergen Reaction Noted   Wound dressing adhesive  03/03/2022   Pembrolizumab Rash 09/02/2020    Family History  Adopted: Yes    Social History   Socioeconomic History   Marital status: Married    Spouse name: Not on file   Number of children: Not on file   Years of education: Not on file   Highest education level: Not on file  Occupational History   Not on file  Tobacco Use   Smoking status: Former    Packs/day: 0.50    Years: 30.00    Total pack years: 15.00    Types: Cigarettes    Quit date: 01/03/2017    Years since quitting: 5.4   Smokeless tobacco: Never  Vaping Use   Vaping Use: Never used  Substance and Sexual Activity   Alcohol use: No    Comment: he has not had alcohol since cancer diagnosis   Drug use: No   Sexual activity: Not on file  Other Topics Concern   Not on file  Social History Narrative   Not on file   Social Determinants of Health   Financial Resource Strain: Not on file  Food Insecurity: Not on file  Transportation Needs: Not on file  Physical Activity: Not on file  Stress: Not on file  Social Connections: Not on file  Intimate Partner Violence: Not on file    Review of Systems: All other review of systems negative except as mentioned in the HPI.  Physical Exam: Vital signs BP 110/66   Pulse (!) 54   Temp (!) 96.4 F (35.8 C) (Temporal)   Ht 5\' 9"  (1.753 m)  Wt 168 lb 8 oz (76.4 kg)   BMI 24.88 kg/m   General:   Alert,  Well-developed, pleasant and cooperative in NAD Lungs:  Clear throughout to auscultation.    Heart:  Regular rate and rhythm Abdomen:  Soft, nontender and nondistended.   Neuro/Psych:  Alert and cooperative. Normal mood and affect. A and O x 3  Jolly Mango, MD [facility]

## 2022-06-03 NOTE — Progress Notes (Signed)
Called to room to assist during endoscopic procedure.  Patient ID and intended procedure confirmed with present staff. Received instructions for my participation in the procedure from the performing physician.

## 2022-06-03 NOTE — Progress Notes (Signed)
Pt's states no medical or surgical changes since previsit or office visit. 

## 2022-06-04 ENCOUNTER — Telehealth: Payer: Self-pay

## 2022-06-04 NOTE — Telephone Encounter (Signed)
  Follow up Call-     06/03/2022    8:54 AM  Call back number  Post procedure Call Back phone  # [phone]  Permission to leave phone message Yes     Patient questions:  Do you have a fever, pain , or abdominal swelling? No. Pain Score  0 *  Have you tolerated food without any problems? Yes.    Have you been able to return to your normal activities? Yes.    Do you have any questions about your discharge instructions: Diet   No. Medications  No. Follow up visit  No.  Do you have questions or concerns about your Care? No.  Actions: * If pain score is 4 or above: No action needed, pain <4.

## 2022-06-11 DIAGNOSIS — Z87891 Personal history of nicotine dependence: Secondary | ICD-10-CM | POA: Diagnosis not present

## 2022-06-11 DIAGNOSIS — C300 Malignant neoplasm of nasal cavity: Secondary | ICD-10-CM | POA: Diagnosis not present

## 2022-06-11 DIAGNOSIS — C7801 Secondary malignant neoplasm of right lung: Secondary | ICD-10-CM | POA: Diagnosis not present

## 2022-06-11 DIAGNOSIS — Z79899 Other long term (current) drug therapy: Secondary | ICD-10-CM | POA: Diagnosis not present

## 2022-06-11 DIAGNOSIS — Z5112 Encounter for antineoplastic immunotherapy: Secondary | ICD-10-CM | POA: Diagnosis not present

## 2022-07-02 DIAGNOSIS — C300 Malignant neoplasm of nasal cavity: Secondary | ICD-10-CM | POA: Diagnosis not present

## 2022-07-02 DIAGNOSIS — R5383 Other fatigue: Secondary | ICD-10-CM | POA: Diagnosis not present

## 2022-07-02 DIAGNOSIS — J984 Other disorders of lung: Secondary | ICD-10-CM | POA: Diagnosis not present

## 2022-07-02 DIAGNOSIS — F32A Depression, unspecified: Secondary | ICD-10-CM | POA: Diagnosis not present

## 2022-07-02 DIAGNOSIS — M1990 Unspecified osteoarthritis, unspecified site: Secondary | ICD-10-CM | POA: Diagnosis not present

## 2022-07-02 DIAGNOSIS — R21 Rash and other nonspecific skin eruption: Secondary | ICD-10-CM | POA: Diagnosis not present

## 2022-07-02 DIAGNOSIS — C7801 Secondary malignant neoplasm of right lung: Secondary | ICD-10-CM | POA: Diagnosis not present

## 2022-07-02 DIAGNOSIS — G893 Neoplasm related pain (acute) (chronic): Secondary | ICD-10-CM | POA: Diagnosis not present

## 2022-07-02 DIAGNOSIS — L299 Pruritus, unspecified: Secondary | ICD-10-CM | POA: Diagnosis not present

## 2022-07-02 DIAGNOSIS — J45909 Unspecified asthma, uncomplicated: Secondary | ICD-10-CM | POA: Diagnosis not present

## 2022-07-03 DIAGNOSIS — C7801 Secondary malignant neoplasm of right lung: Secondary | ICD-10-CM | POA: Diagnosis not present

## 2022-07-03 DIAGNOSIS — Z483 Aftercare following surgery for neoplasm: Secondary | ICD-10-CM | POA: Diagnosis not present

## 2022-07-03 DIAGNOSIS — C300 Malignant neoplasm of nasal cavity: Secondary | ICD-10-CM | POA: Diagnosis not present

## 2022-07-22 ENCOUNTER — Telehealth: Payer: Self-pay | Admitting: *Deleted

## 2022-07-22 NOTE — Progress Notes (Signed)
  Care Coordination  Outreach Note  07/22/2022 Name: Micheal Faulkner MRN: [id number] DOB: 04-25-1971   Care Coordination Outreach Attempts: An unsuccessful telephone outreach was attempted today to offer the patient information about available care coordination services as a benefit of their health plan.   Follow Up Plan:  Additional outreach attempts will be made to offer the patient care coordination information and services.   Encounter Outcome:  No Answer  Julian Hy, [place] Direct Dial: [phone]

## 2022-07-23 DIAGNOSIS — C300 Malignant neoplasm of nasal cavity: Secondary | ICD-10-CM | POA: Diagnosis not present

## 2022-07-23 DIAGNOSIS — Z79899 Other long term (current) drug therapy: Secondary | ICD-10-CM | POA: Diagnosis not present

## 2022-07-23 DIAGNOSIS — Z5112 Encounter for antineoplastic immunotherapy: Secondary | ICD-10-CM | POA: Diagnosis not present

## 2022-08-11 NOTE — Progress Notes (Signed)
  Care Coordination   Note   08/11/2022 Name: Micheal Faulkner MRN: [id number] DOB: 12-04-1970  [name] is a 52 y.o. year old male who sees Velna Hatchet, MD for primary care. I reached out to [name] by phone today to offer care coordination services.  [name] was given information about Care Coordination services today including:   The Care Coordination services include support from the care team which includes your Nurse Coordinator, Clinical Social Worker, or Pharmacist.  The Care Coordination team is here to help remove barriers to the health concerns and goals most important to you. Care Coordination services are voluntary, and the patient may decline or stop services at any time by request to their care team member.   Care Coordination Consent Status: Patient agreed to services and verbal consent obtained.   Follow up plan:  Telephone appointment with care coordination team member scheduled for:  08/21/2022  Encounter Outcome:  Pt. Scheduled  Julian Hy, [place] Direct Dial: [phone]

## 2022-08-13 DIAGNOSIS — Z5112 Encounter for antineoplastic immunotherapy: Secondary | ICD-10-CM | POA: Diagnosis not present

## 2022-08-13 DIAGNOSIS — C300 Malignant neoplasm of nasal cavity: Secondary | ICD-10-CM | POA: Diagnosis not present

## 2022-08-13 DIAGNOSIS — Z79899 Other long term (current) drug therapy: Secondary | ICD-10-CM | POA: Diagnosis not present

## 2022-08-21 ENCOUNTER — Ambulatory Visit: Payer: Self-pay

## 2022-08-21 NOTE — Patient Outreach (Signed)
  Care Coordination   Initial Visit Note   08/21/2022 Name: Micheal Faulkner MRN: [id number] DOB: 1971-05-07  Micheal Faulkner is a 52 y.o. year old male who sees Alysia Penna, MD for primary care. I spoke with  Christianne Borrow by phone today.  What matters to the patients health and wellness today?  Patient would like to better manage his fatigue.     Goals Addressed             This Visit's Progress    To manage fatigue       Care Coordination Interventions: Assessed patient understanding of cancer diagnosis and recommended treatment plan Reviewed upcoming provider appointments and treatment appointments Assessed available transportation to appointments and treatments. Has consistent/reliable transportation: Yes Assessed support system. Has consistent/reliable family or other support: Yes Nutrition assessment performed Educated patient on the importance to balance his activity with rest and to stay well hydrated with water, aim for 48-64 oz daily  Educated patient about the PREP program, patient would like to participate Mailed printed brochure to patient regarding the PREP program/team Placed outbound call to PCP office to request PREP referral     Interventions Today    Flowsheet Row Most Recent Value  Chronic Disease   Chronic disease during today's visit Other  [Cancer, MS]  General Interventions   General Interventions Discussed/Reviewed General Interventions Discussed, General Interventions Reviewed, Doctor Visits  Doctor Visits Discussed/Reviewed PCP, Specialist, Doctor Visits Discussed, Doctor Visits Reviewed  Exercise Interventions   Exercise Discussed/Reviewed Exercise Discussed, Exercise Reviewed, Physical Activity  Physical Activity Discussed/Reviewed Physical Activity Discussed, Physical Activity Reviewed, PREP  Education Interventions   Education Provided Provided Education, Provided Printed Education  Provided Verbal Education On Nutrition, When to see the doctor   Mental Health Interventions   Mental Health Discussed/Reviewed Coping Strategies  Nutrition Interventions   Nutrition Discussed/Reviewed Nutrition Discussed, Nutrition Reviewed, Fluid intake  Pharmacy Interventions   Pharmacy Dicussed/Reviewed Medications and their functions, Pharmacy Topics Discussed          SDOH assessments and interventions completed:  No     Care Coordination Interventions:  Yes, provided   Follow up plan: Referral made to PREP pending PCP approval  Follow up call scheduled for 09/22/22 @10 :30 AM    Encounter Outcome:  Pt. Visit Completed

## 2022-08-21 NOTE — Patient Instructions (Signed)
Visit Information  Thank you for taking time to visit with me today. Please don't hesitate to contact me if I can be of assistance to you.   Following are the goals we discussed today:   Goals Addressed             This Visit's Progress    To manage fatigue       Care Coordination Interventions: Assessed patient understanding of cancer diagnosis and recommended treatment plan Reviewed upcoming provider appointments and treatment appointments Assessed available transportation to appointments and treatments. Has consistent/reliable transportation: Yes Assessed support system. Has consistent/reliable family or other support: Yes Nutrition assessment performed Educated patient on the importance to balance his activity with rest and to stay well hydrated with water, aim for 48-64 oz daily  Educated patient about the PREP program, patient would like to participate Mailed printed brochure to patient regarding the PREP program/team Placed outbound call to PCP office to request PREP referral         Our next appointment is by telephone on 09/22/22 at 10:30 AM  Please call the care guide team at [phone] if you need to cancel or reschedule your appointment.   If you are experiencing a Mental Health or Behavioral Health Crisis or need someone to talk to, please call 1-800-273-TALK (toll free, 24 hour hotline) go to [facility] [address] [place] [phone])  Patient verbalizes understanding of instructions and care plan provided today and agrees to view in MyChart. Active MyChart status and patient understanding of how to access instructions and care plan via MyChart confirmed with patient.     Delsa Sale, RN, BSN, CCM Care Management Coordinator [facility] Care Management Direct Phone: [phone]

## 2022-08-24 ENCOUNTER — Ambulatory Visit: Payer: Self-pay

## 2022-08-24 NOTE — Patient Outreach (Signed)
  Care Coordination   Follow Up Visit Note   08/24/2022 Name: Micheal Faulkner MRN: [id number] DOB: [birth year]  Micheal Faulkner is a 52 y.o. year old male who sees Alysia Penna, MD for primary care. I placed an outbound call to the providers office today.  What matters to the patients health and wellness today?  N/a    Goals Addressed             This Visit's Progress    To manage fatigue       Care Coordination Interventions: Placed outbound call to PCP office for Dr. Alysia Penna to request PREP referral, recording states the office phone line stops taking calls at 4 pm        SDOH assessments and interventions completed:  No     Care Coordination Interventions:  Yes, provided   Follow up plan: Follow up call scheduled for 09/22/22 @10 :30 AM    Encounter Outcome:  Pt. Visit Completed

## 2022-08-25 ENCOUNTER — Ambulatory Visit: Payer: Self-pay

## 2022-08-25 NOTE — Patient Instructions (Signed)
Visit Information  Thank you for taking time to visit with me today. Please don't hesitate to contact me if I can be of assistance to you.   Following are the goals we discussed today:   Goals Addressed             This Visit's Progress    To manage fatigue       Care Coordination Interventions: Placed outbound call to PCP office for Dr. Scott HolwAlysia Pennauest PREP referral, left a detailed voice message from nurse Morrie Sheldon requesting this referral Provided the contact number for this RN and encouraged a call back if needed          Our next appointment is by telephone on 09/22/22 at 10:30 AM by telephone Please call the care guide team at [phone] if you need to cancel or reschedule your appointment.   If you are experiencing a Mental Health or Behavioral Health Crisis or need someone to talk to, please call 1-800-273-TALK (toll free, 24 hour hotline) go to [facility] [address], [place] [phone])  Patient verbalizes understanding of instructions and care plan provided today and agrees to view in MyChart. Active MyChart status and patient understanding of how to access instructions and care plan via MyChart confirmed with patient.     Delsa Sale, RN, BSN, CCM Care Management Coordinator [facility] Care Management Direct Phone: [phone]

## 2022-08-25 NOTE — Patient Outreach (Signed)
  Care Coordination   Follow Up Visit Note   08/25/2022 Name: Enzio Buchler MRN: [id number] DOB: 05-29-1970  Bentlee Benningfield is a 52 y.o. year old male who sees Alysia Penna, MD for primary care. I engaged with nurse Morrie Sheldon in the providers office today.  What matters to the patients health and wellness today?   Patient would like to participate in the PREP program.    Goals Addressed             This Visit's Progress    To manage fatigue       Care Coordination Interventions: Placed outbound call to PCP office for Dr. Alysia Penna to request PREP referral, left a detailed voice message from nurse Morrie Sheldon requesting this referral Provided the contact number for this RN and encouraged a call back if needed     Interventions Today    Flowsheet Row Most Recent Value  General Interventions   General Interventions Discussed/Reviewed Communication with  Communication with RN  Letta Kocher with Dr. Lorin Picket [place]]           SDOH assessments and interventions completed:  No     Care Coordination Interventions:  Yes, provided   Follow up plan: Follow up call scheduled for 09/22/22 :30 AM    Encounter Outcome:  Pt. Visit Completed

## 2022-08-26 NOTE — Patient Outreach (Signed)
Received a faxed referral from Dr. Alysia Penna for the [facility] PREP program.   I have sent this referral to the PREP team to call [name].  [name], [name] Care Management Assistant [facility] Care Management [phone]

## 2022-08-27 ENCOUNTER — Telehealth: Payer: Self-pay | Admitting: *Deleted

## 2022-08-27 NOTE — Telephone Encounter (Signed)
Contacted regarding PREP Class referral. Left voice message to return call for more information. 

## 2022-09-03 DIAGNOSIS — M950 Acquired deformity of nose: Secondary | ICD-10-CM | POA: Diagnosis not present

## 2022-09-03 DIAGNOSIS — R918 Other nonspecific abnormal finding of lung field: Secondary | ICD-10-CM | POA: Diagnosis not present

## 2022-09-03 DIAGNOSIS — R21 Rash and other nonspecific skin eruption: Secondary | ICD-10-CM | POA: Diagnosis not present

## 2022-09-03 DIAGNOSIS — F0631 Mood disorder due to known physiological condition with depressive features: Secondary | ICD-10-CM | POA: Diagnosis not present

## 2022-09-03 DIAGNOSIS — G893 Neoplasm related pain (acute) (chronic): Secondary | ICD-10-CM | POA: Diagnosis not present

## 2022-09-03 DIAGNOSIS — L298 Other pruritus: Secondary | ICD-10-CM | POA: Diagnosis not present

## 2022-09-03 DIAGNOSIS — M1990 Unspecified osteoarthritis, unspecified site: Secondary | ICD-10-CM | POA: Diagnosis not present

## 2022-09-03 DIAGNOSIS — J45909 Unspecified asthma, uncomplicated: Secondary | ICD-10-CM | POA: Diagnosis not present

## 2022-09-03 DIAGNOSIS — C300 Malignant neoplasm of nasal cavity: Secondary | ICD-10-CM | POA: Diagnosis not present

## 2022-09-03 DIAGNOSIS — Z8522 Personal history of malignant neoplasm of nasal cavities, middle ear, and accessory sinuses: Secondary | ICD-10-CM | POA: Diagnosis not present

## 2022-09-03 DIAGNOSIS — R110 Nausea: Secondary | ICD-10-CM | POA: Diagnosis not present

## 2022-09-03 DIAGNOSIS — J8283 Eosinophilic asthma: Secondary | ICD-10-CM | POA: Diagnosis not present

## 2022-09-21 DIAGNOSIS — F322 Major depressive disorder, single episode, severe without psychotic features: Secondary | ICD-10-CM | POA: Diagnosis not present

## 2022-09-21 DIAGNOSIS — F4321 Adjustment disorder with depressed mood: Secondary | ICD-10-CM | POA: Diagnosis not present

## 2022-09-21 DIAGNOSIS — F439 Reaction to severe stress, unspecified: Secondary | ICD-10-CM | POA: Diagnosis not present

## 2022-09-22 ENCOUNTER — Ambulatory Visit: Payer: Self-pay

## 2022-09-22 NOTE — Patient Outreach (Signed)
  Care Coordination   09/22/2022 Name: Micheal Faulkner MRN: [id number] DOB: 01-15-1971   Care Coordination Outreach Attempts:  An unsuccessful telephone outreach was attempted for a scheduled appointment today.  Follow Up Plan:  Additional outreach attempts will be made to offer the patient care coordination information and services.   Encounter Outcome:  No Answer   Care Coordination Interventions:  No, not indicated    Delsa Sale, RN, BSN, CCM Care Management Coordinator [facility] Care Management Direct Phone: [phone]

## 2022-09-24 DIAGNOSIS — C300 Malignant neoplasm of nasal cavity: Secondary | ICD-10-CM | POA: Diagnosis not present

## 2022-09-24 DIAGNOSIS — Z79899 Other long term (current) drug therapy: Secondary | ICD-10-CM | POA: Diagnosis not present

## 2022-09-24 DIAGNOSIS — Z5112 Encounter for antineoplastic immunotherapy: Secondary | ICD-10-CM | POA: Diagnosis not present

## 2022-09-28 ENCOUNTER — Telehealth: Payer: Self-pay | Admitting: *Deleted

## 2022-09-28 NOTE — Progress Notes (Signed)
  Care Coordination Note  09/28/2022 Name: Micheal Faulkner MRN: [id number] DOB: May 20, 1970  Micheal Faulkner is a 52 y.o. year old male who is a primary care patient of Alysia Penna, MD and is actively engaged with the care management team. I reached out to Christianne Borrow by phone today to assist with re-scheduling a follow up visit with the RN Case Manager  Follow up plan: Unsuccessful telephone outreach attempt made. A HIPAA compliant phone message was left for the patient providing contact information and requesting a return call.   [facility]  Care Coordination Care Guide  Direct Dial: [phone]

## 2022-10-02 NOTE — Progress Notes (Signed)
  Care Coordination Note  10/02/2022 Name: Micheal Faulkner MRN: [id number] DOB: [birth year]  [name] is a 52 y.o. year old male who is a primary care patient of Alysia Penna, MD and is actively engaged with the care management team. I reached out to Christianne Borrow by phone today to assist with re-scheduling a follow up visit with the RN Case Manager  Follow up plan: Unsuccessful telephone outreach attempt made. A HIPAA compliant phone message was left for the patient providing contact information and requesting a return call.  We have been unable to make contact with the patient for follow up. The care management team is available to follow up with the patient after provider conversation with the patient regarding recommendation for care management engagement and subsequent re-referral to the care management team.  [facility] Coordination Care Guide  Direct Dial: [phone]

## 2022-10-15 DIAGNOSIS — Z79899 Other long term (current) drug therapy: Secondary | ICD-10-CM | POA: Diagnosis not present

## 2022-10-15 DIAGNOSIS — Z5112 Encounter for antineoplastic immunotherapy: Secondary | ICD-10-CM | POA: Diagnosis not present

## 2022-10-15 DIAGNOSIS — C300 Malignant neoplasm of nasal cavity: Secondary | ICD-10-CM | POA: Diagnosis not present

## 2022-11-05 DIAGNOSIS — Z79899 Other long term (current) drug therapy: Secondary | ICD-10-CM | POA: Diagnosis not present

## 2022-11-05 DIAGNOSIS — J45909 Unspecified asthma, uncomplicated: Secondary | ICD-10-CM | POA: Diagnosis not present

## 2022-11-05 DIAGNOSIS — Z5112 Encounter for antineoplastic immunotherapy: Secondary | ICD-10-CM | POA: Diagnosis not present

## 2022-11-05 DIAGNOSIS — J3489 Other specified disorders of nose and nasal sinuses: Secondary | ICD-10-CM | POA: Diagnosis not present

## 2022-11-05 DIAGNOSIS — G893 Neoplasm related pain (acute) (chronic): Secondary | ICD-10-CM | POA: Diagnosis not present

## 2022-11-05 DIAGNOSIS — C300 Malignant neoplasm of nasal cavity: Secondary | ICD-10-CM | POA: Diagnosis not present

## 2022-11-12 DIAGNOSIS — C300 Malignant neoplasm of nasal cavity: Secondary | ICD-10-CM | POA: Diagnosis not present

## 2022-11-18 DIAGNOSIS — F322 Major depressive disorder, single episode, severe without psychotic features: Secondary | ICD-10-CM | POA: Diagnosis not present

## 2022-11-18 DIAGNOSIS — F439 Reaction to severe stress, unspecified: Secondary | ICD-10-CM | POA: Diagnosis not present

## 2022-11-18 DIAGNOSIS — F4321 Adjustment disorder with depressed mood: Secondary | ICD-10-CM | POA: Diagnosis not present

## 2022-11-26 DIAGNOSIS — Z79899 Other long term (current) drug therapy: Secondary | ICD-10-CM | POA: Diagnosis not present

## 2022-11-26 DIAGNOSIS — Z5112 Encounter for antineoplastic immunotherapy: Secondary | ICD-10-CM | POA: Diagnosis not present

## 2022-11-26 DIAGNOSIS — C300 Malignant neoplasm of nasal cavity: Secondary | ICD-10-CM | POA: Diagnosis not present

## 2022-12-14 DIAGNOSIS — C300 Malignant neoplasm of nasal cavity: Secondary | ICD-10-CM | POA: Diagnosis not present

## 2022-12-16 DIAGNOSIS — R911 Solitary pulmonary nodule: Secondary | ICD-10-CM | POA: Diagnosis not present

## 2022-12-16 DIAGNOSIS — C300 Malignant neoplasm of nasal cavity: Secondary | ICD-10-CM | POA: Diagnosis not present

## 2022-12-24 DIAGNOSIS — R21 Rash and other nonspecific skin eruption: Secondary | ICD-10-CM | POA: Diagnosis not present

## 2022-12-24 DIAGNOSIS — Z79899 Other long term (current) drug therapy: Secondary | ICD-10-CM | POA: Diagnosis not present

## 2022-12-24 DIAGNOSIS — Z5112 Encounter for antineoplastic immunotherapy: Secondary | ICD-10-CM | POA: Diagnosis not present

## 2022-12-24 DIAGNOSIS — R911 Solitary pulmonary nodule: Secondary | ICD-10-CM | POA: Diagnosis not present

## 2022-12-24 DIAGNOSIS — F3289 Other specified depressive episodes: Secondary | ICD-10-CM | POA: Diagnosis not present

## 2022-12-24 DIAGNOSIS — G893 Neoplasm related pain (acute) (chronic): Secondary | ICD-10-CM | POA: Diagnosis not present

## 2022-12-24 DIAGNOSIS — C300 Malignant neoplasm of nasal cavity: Secondary | ICD-10-CM | POA: Diagnosis not present

## 2022-12-24 DIAGNOSIS — R930 Abnormal findings on diagnostic imaging of skull and head, not elsewhere classified: Secondary | ICD-10-CM | POA: Diagnosis not present

## 2022-12-24 DIAGNOSIS — J45909 Unspecified asthma, uncomplicated: Secondary | ICD-10-CM | POA: Diagnosis not present

## 2022-12-24 DIAGNOSIS — R110 Nausea: Secondary | ICD-10-CM | POA: Diagnosis not present

## 2023-01-06 DIAGNOSIS — E785 Hyperlipidemia, unspecified: Secondary | ICD-10-CM | POA: Diagnosis not present

## 2023-01-06 DIAGNOSIS — Z125 Encounter for screening for malignant neoplasm of prostate: Secondary | ICD-10-CM | POA: Diagnosis not present

## 2023-01-06 DIAGNOSIS — E291 Testicular hypofunction: Secondary | ICD-10-CM | POA: Diagnosis not present

## 2023-01-06 DIAGNOSIS — R7989 Other specified abnormal findings of blood chemistry: Secondary | ICD-10-CM | POA: Diagnosis not present

## 2023-01-13 DIAGNOSIS — F419 Anxiety disorder, unspecified: Secondary | ICD-10-CM | POA: Diagnosis not present

## 2023-01-13 DIAGNOSIS — C760 Malignant neoplasm of head, face and neck: Secondary | ICD-10-CM | POA: Diagnosis not present

## 2023-01-13 DIAGNOSIS — G35 Multiple sclerosis: Secondary | ICD-10-CM | POA: Diagnosis not present

## 2023-01-13 DIAGNOSIS — F330 Major depressive disorder, recurrent, mild: Secondary | ICD-10-CM | POA: Diagnosis not present

## 2023-01-13 DIAGNOSIS — Z1331 Encounter for screening for depression: Secondary | ICD-10-CM | POA: Diagnosis not present

## 2023-01-13 DIAGNOSIS — J45909 Unspecified asthma, uncomplicated: Secondary | ICD-10-CM | POA: Diagnosis not present

## 2023-01-13 DIAGNOSIS — E785 Hyperlipidemia, unspecified: Secondary | ICD-10-CM | POA: Diagnosis not present

## 2023-01-13 DIAGNOSIS — Z0000 Encounter for general adult medical examination without abnormal findings: Secondary | ICD-10-CM | POA: Diagnosis not present

## 2023-01-13 DIAGNOSIS — Z1339 Encounter for screening examination for other mental health and behavioral disorders: Secondary | ICD-10-CM | POA: Diagnosis not present

## 2023-01-13 DIAGNOSIS — J849 Interstitial pulmonary disease, unspecified: Secondary | ICD-10-CM | POA: Diagnosis not present

## 2023-01-13 DIAGNOSIS — E291 Testicular hypofunction: Secondary | ICD-10-CM | POA: Diagnosis not present

## 2023-01-14 DIAGNOSIS — Z79899 Other long term (current) drug therapy: Secondary | ICD-10-CM | POA: Diagnosis not present

## 2023-01-14 DIAGNOSIS — C300 Malignant neoplasm of nasal cavity: Secondary | ICD-10-CM | POA: Diagnosis not present

## 2023-01-14 DIAGNOSIS — Z5112 Encounter for antineoplastic immunotherapy: Secondary | ICD-10-CM | POA: Diagnosis not present

## 2023-02-04 DIAGNOSIS — Z79899 Other long term (current) drug therapy: Secondary | ICD-10-CM | POA: Diagnosis not present

## 2023-02-04 DIAGNOSIS — C300 Malignant neoplasm of nasal cavity: Secondary | ICD-10-CM | POA: Diagnosis not present

## 2023-02-04 DIAGNOSIS — Z5112 Encounter for antineoplastic immunotherapy: Secondary | ICD-10-CM | POA: Diagnosis not present

## 2023-02-10 DIAGNOSIS — C300 Malignant neoplasm of nasal cavity: Secondary | ICD-10-CM | POA: Diagnosis not present

## 2023-02-25 DIAGNOSIS — Z5112 Encounter for antineoplastic immunotherapy: Secondary | ICD-10-CM | POA: Diagnosis not present

## 2023-02-25 DIAGNOSIS — C300 Malignant neoplasm of nasal cavity: Secondary | ICD-10-CM | POA: Diagnosis not present

## 2023-02-25 DIAGNOSIS — G893 Neoplasm related pain (acute) (chronic): Secondary | ICD-10-CM | POA: Diagnosis not present

## 2023-02-25 DIAGNOSIS — M950 Acquired deformity of nose: Secondary | ICD-10-CM | POA: Diagnosis not present

## 2023-02-25 DIAGNOSIS — Z79899 Other long term (current) drug therapy: Secondary | ICD-10-CM | POA: Diagnosis not present

## 2023-03-18 DIAGNOSIS — C300 Malignant neoplasm of nasal cavity: Secondary | ICD-10-CM | POA: Diagnosis not present

## 2023-03-18 DIAGNOSIS — Z79899 Other long term (current) drug therapy: Secondary | ICD-10-CM | POA: Diagnosis not present

## 2023-03-18 DIAGNOSIS — Z5112 Encounter for antineoplastic immunotherapy: Secondary | ICD-10-CM | POA: Diagnosis not present

## 2023-04-13 DIAGNOSIS — C300 Malignant neoplasm of nasal cavity: Secondary | ICD-10-CM | POA: Diagnosis not present

## 2023-04-15 DIAGNOSIS — G893 Neoplasm related pain (acute) (chronic): Secondary | ICD-10-CM | POA: Diagnosis not present

## 2023-04-15 DIAGNOSIS — C300 Malignant neoplasm of nasal cavity: Secondary | ICD-10-CM | POA: Diagnosis not present

## 2023-04-15 DIAGNOSIS — Z79899 Other long term (current) drug therapy: Secondary | ICD-10-CM | POA: Diagnosis not present

## 2023-04-15 DIAGNOSIS — R110 Nausea: Secondary | ICD-10-CM | POA: Diagnosis not present

## 2023-04-15 DIAGNOSIS — F339 Major depressive disorder, recurrent, unspecified: Secondary | ICD-10-CM | POA: Diagnosis not present

## 2023-04-15 DIAGNOSIS — Z5112 Encounter for antineoplastic immunotherapy: Secondary | ICD-10-CM | POA: Diagnosis not present

## 2023-05-13 DIAGNOSIS — C300 Malignant neoplasm of nasal cavity: Secondary | ICD-10-CM | POA: Diagnosis not present

## 2023-05-13 DIAGNOSIS — Z5112 Encounter for antineoplastic immunotherapy: Secondary | ICD-10-CM | POA: Diagnosis not present

## 2023-05-13 DIAGNOSIS — Z79899 Other long term (current) drug therapy: Secondary | ICD-10-CM | POA: Diagnosis not present

## 2023-06-03 DIAGNOSIS — C300 Malignant neoplasm of nasal cavity: Secondary | ICD-10-CM | POA: Diagnosis not present

## 2023-06-03 DIAGNOSIS — Z79899 Other long term (current) drug therapy: Secondary | ICD-10-CM | POA: Diagnosis not present

## 2023-06-03 DIAGNOSIS — Z5112 Encounter for antineoplastic immunotherapy: Secondary | ICD-10-CM | POA: Diagnosis not present

## 2023-06-24 DIAGNOSIS — Z5112 Encounter for antineoplastic immunotherapy: Secondary | ICD-10-CM | POA: Diagnosis not present

## 2023-06-24 DIAGNOSIS — G893 Neoplasm related pain (acute) (chronic): Secondary | ICD-10-CM | POA: Diagnosis not present

## 2023-06-24 DIAGNOSIS — Z79899 Other long term (current) drug therapy: Secondary | ICD-10-CM | POA: Diagnosis not present

## 2023-06-24 DIAGNOSIS — C300 Malignant neoplasm of nasal cavity: Secondary | ICD-10-CM | POA: Diagnosis not present

## 2023-06-28 DIAGNOSIS — C300 Malignant neoplasm of nasal cavity: Secondary | ICD-10-CM | POA: Diagnosis not present

## 2023-07-09 DIAGNOSIS — C300 Malignant neoplasm of nasal cavity: Secondary | ICD-10-CM | POA: Diagnosis not present

## 2023-07-09 DIAGNOSIS — J45909 Unspecified asthma, uncomplicated: Secondary | ICD-10-CM | POA: Diagnosis not present

## 2023-07-09 DIAGNOSIS — R911 Solitary pulmonary nodule: Secondary | ICD-10-CM | POA: Diagnosis not present

## 2023-07-15 DIAGNOSIS — T82898A Other specified complication of vascular prosthetic devices, implants and grafts, initial encounter: Secondary | ICD-10-CM | POA: Diagnosis not present

## 2023-07-15 DIAGNOSIS — Z5112 Encounter for antineoplastic immunotherapy: Secondary | ICD-10-CM | POA: Diagnosis not present

## 2023-07-15 DIAGNOSIS — Z79899 Other long term (current) drug therapy: Secondary | ICD-10-CM | POA: Diagnosis not present

## 2023-07-15 DIAGNOSIS — C300 Malignant neoplasm of nasal cavity: Secondary | ICD-10-CM | POA: Diagnosis not present

## 2023-07-15 DIAGNOSIS — Z5111 Encounter for antineoplastic chemotherapy: Secondary | ICD-10-CM | POA: Diagnosis not present

## 2023-08-05 DIAGNOSIS — T82898A Other specified complication of vascular prosthetic devices, implants and grafts, initial encounter: Secondary | ICD-10-CM | POA: Diagnosis not present

## 2023-08-05 DIAGNOSIS — Z5112 Encounter for antineoplastic immunotherapy: Secondary | ICD-10-CM | POA: Diagnosis not present

## 2023-08-05 DIAGNOSIS — C300 Malignant neoplasm of nasal cavity: Secondary | ICD-10-CM | POA: Diagnosis not present

## 2023-08-05 DIAGNOSIS — Z5111 Encounter for antineoplastic chemotherapy: Secondary | ICD-10-CM | POA: Diagnosis not present

## 2023-08-05 DIAGNOSIS — Z79899 Other long term (current) drug therapy: Secondary | ICD-10-CM | POA: Diagnosis not present

## 2023-09-02 DIAGNOSIS — Z5112 Encounter for antineoplastic immunotherapy: Secondary | ICD-10-CM | POA: Diagnosis not present

## 2023-09-02 DIAGNOSIS — C300 Malignant neoplasm of nasal cavity: Secondary | ICD-10-CM | POA: Diagnosis not present

## 2023-09-02 DIAGNOSIS — G893 Neoplasm related pain (acute) (chronic): Secondary | ICD-10-CM | POA: Diagnosis not present

## 2023-09-02 DIAGNOSIS — Z79899 Other long term (current) drug therapy: Secondary | ICD-10-CM | POA: Diagnosis not present

## 2023-09-02 DIAGNOSIS — J45909 Unspecified asthma, uncomplicated: Secondary | ICD-10-CM | POA: Diagnosis not present

## 2023-09-16 DIAGNOSIS — M9901 Segmental and somatic dysfunction of cervical region: Secondary | ICD-10-CM | POA: Diagnosis not present

## 2023-09-16 DIAGNOSIS — M50322 Other cervical disc degeneration at C5-C6 level: Secondary | ICD-10-CM | POA: Diagnosis not present

## 2023-09-23 DIAGNOSIS — Z79899 Other long term (current) drug therapy: Secondary | ICD-10-CM | POA: Diagnosis not present

## 2023-09-23 DIAGNOSIS — C300 Malignant neoplasm of nasal cavity: Secondary | ICD-10-CM | POA: Diagnosis not present

## 2023-09-23 DIAGNOSIS — Z5112 Encounter for antineoplastic immunotherapy: Secondary | ICD-10-CM | POA: Diagnosis not present

## 2023-10-14 DIAGNOSIS — C300 Malignant neoplasm of nasal cavity: Secondary | ICD-10-CM | POA: Diagnosis not present

## 2023-10-15 DIAGNOSIS — F322 Major depressive disorder, single episode, severe without psychotic features: Secondary | ICD-10-CM | POA: Diagnosis not present

## 2023-10-15 DIAGNOSIS — F4321 Adjustment disorder with depressed mood: Secondary | ICD-10-CM | POA: Diagnosis not present

## 2023-11-11 DIAGNOSIS — G893 Neoplasm related pain (acute) (chronic): Secondary | ICD-10-CM | POA: Diagnosis not present

## 2023-11-11 DIAGNOSIS — C300 Malignant neoplasm of nasal cavity: Secondary | ICD-10-CM | POA: Diagnosis not present

## 2023-11-11 DIAGNOSIS — Z5112 Encounter for antineoplastic immunotherapy: Secondary | ICD-10-CM | POA: Diagnosis not present

## 2023-11-11 DIAGNOSIS — Z79899 Other long term (current) drug therapy: Secondary | ICD-10-CM | POA: Diagnosis not present

## 2023-11-11 DIAGNOSIS — J309 Allergic rhinitis, unspecified: Secondary | ICD-10-CM | POA: Diagnosis not present

## 2023-12-02 DIAGNOSIS — J309 Allergic rhinitis, unspecified: Secondary | ICD-10-CM | POA: Diagnosis not present

## 2023-12-02 DIAGNOSIS — C300 Malignant neoplasm of nasal cavity: Secondary | ICD-10-CM | POA: Diagnosis not present

## 2023-12-02 DIAGNOSIS — G893 Neoplasm related pain (acute) (chronic): Secondary | ICD-10-CM | POA: Diagnosis not present

## 2023-12-02 DIAGNOSIS — Z79899 Other long term (current) drug therapy: Secondary | ICD-10-CM | POA: Diagnosis not present

## 2023-12-02 DIAGNOSIS — Z5112 Encounter for antineoplastic immunotherapy: Secondary | ICD-10-CM | POA: Diagnosis not present

## 2023-12-13 DIAGNOSIS — C300 Malignant neoplasm of nasal cavity: Secondary | ICD-10-CM | POA: Diagnosis not present

## 2023-12-23 DIAGNOSIS — Z5112 Encounter for antineoplastic immunotherapy: Secondary | ICD-10-CM | POA: Diagnosis not present

## 2023-12-23 DIAGNOSIS — C300 Malignant neoplasm of nasal cavity: Secondary | ICD-10-CM | POA: Diagnosis not present

## 2023-12-23 DIAGNOSIS — Z79899 Other long term (current) drug therapy: Secondary | ICD-10-CM | POA: Diagnosis not present

## 2024-01-13 DIAGNOSIS — Z5112 Encounter for antineoplastic immunotherapy: Secondary | ICD-10-CM | POA: Diagnosis not present

## 2024-01-13 DIAGNOSIS — C300 Malignant neoplasm of nasal cavity: Secondary | ICD-10-CM | POA: Diagnosis not present

## 2024-01-13 DIAGNOSIS — Z79899 Other long term (current) drug therapy: Secondary | ICD-10-CM | POA: Diagnosis not present

## 2024-01-13 DIAGNOSIS — G893 Neoplasm related pain (acute) (chronic): Secondary | ICD-10-CM | POA: Diagnosis not present

## 2024-01-14 DIAGNOSIS — Z1389 Encounter for screening for other disorder: Secondary | ICD-10-CM | POA: Diagnosis not present

## 2024-01-14 DIAGNOSIS — R7989 Other specified abnormal findings of blood chemistry: Secondary | ICD-10-CM | POA: Diagnosis not present

## 2024-01-14 DIAGNOSIS — Z125 Encounter for screening for malignant neoplasm of prostate: Secondary | ICD-10-CM | POA: Diagnosis not present

## 2024-01-14 DIAGNOSIS — E785 Hyperlipidemia, unspecified: Secondary | ICD-10-CM | POA: Diagnosis not present

## 2024-01-14 DIAGNOSIS — E291 Testicular hypofunction: Secondary | ICD-10-CM | POA: Diagnosis not present

## 2024-01-18 DIAGNOSIS — F4321 Adjustment disorder with depressed mood: Secondary | ICD-10-CM | POA: Diagnosis not present

## 2024-01-18 DIAGNOSIS — F322 Major depressive disorder, single episode, severe without psychotic features: Secondary | ICD-10-CM | POA: Diagnosis not present

## 2024-01-21 DIAGNOSIS — C760 Malignant neoplasm of head, face and neck: Secondary | ICD-10-CM | POA: Diagnosis not present

## 2024-01-21 DIAGNOSIS — Z23 Encounter for immunization: Secondary | ICD-10-CM | POA: Diagnosis not present

## 2024-01-21 DIAGNOSIS — G4700 Insomnia, unspecified: Secondary | ICD-10-CM | POA: Diagnosis not present

## 2024-01-21 DIAGNOSIS — J45909 Unspecified asthma, uncomplicated: Secondary | ICD-10-CM | POA: Diagnosis not present

## 2024-01-21 DIAGNOSIS — J849 Interstitial pulmonary disease, unspecified: Secondary | ICD-10-CM | POA: Diagnosis not present

## 2024-01-21 DIAGNOSIS — E291 Testicular hypofunction: Secondary | ICD-10-CM | POA: Diagnosis not present

## 2024-01-21 DIAGNOSIS — F330 Major depressive disorder, recurrent, mild: Secondary | ICD-10-CM | POA: Diagnosis not present

## 2024-01-21 DIAGNOSIS — Z0000 Encounter for general adult medical examination without abnormal findings: Secondary | ICD-10-CM | POA: Diagnosis not present

## 2024-01-21 DIAGNOSIS — F419 Anxiety disorder, unspecified: Secondary | ICD-10-CM | POA: Diagnosis not present

## 2024-01-21 DIAGNOSIS — Z1339 Encounter for screening examination for other mental health and behavioral disorders: Secondary | ICD-10-CM | POA: Diagnosis not present

## 2024-01-21 DIAGNOSIS — E785 Hyperlipidemia, unspecified: Secondary | ICD-10-CM | POA: Diagnosis not present

## 2024-01-21 DIAGNOSIS — Z1331 Encounter for screening for depression: Secondary | ICD-10-CM | POA: Diagnosis not present

## 2024-02-03 DIAGNOSIS — Z79899 Other long term (current) drug therapy: Secondary | ICD-10-CM | POA: Diagnosis not present

## 2024-02-03 DIAGNOSIS — C300 Malignant neoplasm of nasal cavity: Secondary | ICD-10-CM | POA: Diagnosis not present

## 2024-02-03 DIAGNOSIS — Z5112 Encounter for antineoplastic immunotherapy: Secondary | ICD-10-CM | POA: Diagnosis not present

## 2024-03-02 DIAGNOSIS — M950 Acquired deformity of nose: Secondary | ICD-10-CM | POA: Diagnosis not present

## 2024-03-02 DIAGNOSIS — C300 Malignant neoplasm of nasal cavity: Secondary | ICD-10-CM | POA: Diagnosis not present

## 2024-03-02 DIAGNOSIS — Z5112 Encounter for antineoplastic immunotherapy: Secondary | ICD-10-CM | POA: Diagnosis not present

## 2024-03-02 DIAGNOSIS — T829XXD Unspecified complication of cardiac and vascular prosthetic device, implant and graft, subsequent encounter: Secondary | ICD-10-CM | POA: Diagnosis not present

## 2024-03-02 DIAGNOSIS — R439 Unspecified disturbances of smell and taste: Secondary | ICD-10-CM | POA: Diagnosis not present

## 2024-03-02 DIAGNOSIS — G893 Neoplasm related pain (acute) (chronic): Secondary | ICD-10-CM | POA: Diagnosis not present

## 2024-03-02 DIAGNOSIS — Z79899 Other long term (current) drug therapy: Secondary | ICD-10-CM | POA: Diagnosis not present

## 2024-03-20 DIAGNOSIS — R911 Solitary pulmonary nodule: Secondary | ICD-10-CM | POA: Diagnosis not present

## 2024-03-20 DIAGNOSIS — R918 Other nonspecific abnormal finding of lung field: Secondary | ICD-10-CM | POA: Diagnosis not present

## 2024-03-20 DIAGNOSIS — C300 Malignant neoplasm of nasal cavity: Secondary | ICD-10-CM | POA: Diagnosis not present

## 2024-03-20 DIAGNOSIS — M950 Acquired deformity of nose: Secondary | ICD-10-CM | POA: Diagnosis not present

## 2024-03-20 DIAGNOSIS — G893 Neoplasm related pain (acute) (chronic): Secondary | ICD-10-CM | POA: Diagnosis not present

## 2024-03-23 DIAGNOSIS — Z79899 Other long term (current) drug therapy: Secondary | ICD-10-CM | POA: Diagnosis not present

## 2024-03-23 DIAGNOSIS — Z5112 Encounter for antineoplastic immunotherapy: Secondary | ICD-10-CM | POA: Diagnosis not present

## 2024-03-23 DIAGNOSIS — C300 Malignant neoplasm of nasal cavity: Secondary | ICD-10-CM | POA: Diagnosis not present

## 2024-04-13 DIAGNOSIS — M950 Acquired deformity of nose: Secondary | ICD-10-CM | POA: Diagnosis not present

## 2024-04-13 DIAGNOSIS — G893 Neoplasm related pain (acute) (chronic): Secondary | ICD-10-CM | POA: Diagnosis not present

## 2024-04-13 DIAGNOSIS — C300 Malignant neoplasm of nasal cavity: Secondary | ICD-10-CM | POA: Diagnosis not present
